# Patient Record
Sex: Female | Born: 1970
Health system: Southern US, Community
[De-identification: ages and names within clinical notes are randomized; demographics above are authoritative.]

## PROBLEM LIST (undated history)

## (undated) DIAGNOSIS — D219 Benign neoplasm of connective and other soft tissue, unspecified: Secondary | ICD-10-CM

## (undated) DIAGNOSIS — K219 Gastro-esophageal reflux disease without esophagitis: Secondary | ICD-10-CM

## (undated) HISTORY — DX: Benign neoplasm of connective and other soft tissue, unspecified: D21.9

## (undated) HISTORY — DX: Gastro-esophageal reflux disease without esophagitis: K21.9

---

## 2000-10-17 ENCOUNTER — Other Ambulatory Visit: Admission: RE | Admit: 2000-10-17 | Discharge: 2000-10-17 | Payer: Self-pay | Admitting: Obstetrics and Gynecology

## 2001-10-22 ENCOUNTER — Other Ambulatory Visit: Admission: RE | Admit: 2001-10-22 | Discharge: 2001-10-22 | Payer: Self-pay | Admitting: Obstetrics and Gynecology

## 2002-10-30 ENCOUNTER — Other Ambulatory Visit: Admission: RE | Admit: 2002-10-30 | Discharge: 2002-10-30 | Payer: Self-pay | Admitting: Obstetrics and Gynecology

## 2003-11-04 ENCOUNTER — Other Ambulatory Visit: Admission: RE | Admit: 2003-11-04 | Discharge: 2003-11-04 | Payer: Self-pay | Admitting: Obstetrics and Gynecology

## 2004-11-10 ENCOUNTER — Other Ambulatory Visit: Admission: RE | Admit: 2004-11-10 | Discharge: 2004-11-10 | Payer: Self-pay | Admitting: Obstetrics and Gynecology

## 2011-05-16 ENCOUNTER — Other Ambulatory Visit: Payer: Self-pay | Admitting: Obstetrics and Gynecology

## 2011-05-16 DIAGNOSIS — Z1231 Encounter for screening mammogram for malignant neoplasm of breast: Secondary | ICD-10-CM

## 2011-05-28 ENCOUNTER — Ambulatory Visit
Admission: RE | Admit: 2011-05-28 | Discharge: 2011-05-28 | Disposition: A | Discharge status: Home / Self Care | Payer: 59 | Source: Ambulatory Visit | Attending: Obstetrics and Gynecology | Admitting: Obstetrics and Gynecology

## 2011-05-28 DIAGNOSIS — Z1231 Encounter for screening mammogram for malignant neoplasm of breast: Secondary | ICD-10-CM

## 2011-05-29 ENCOUNTER — Other Ambulatory Visit: Payer: Self-pay | Admitting: Obstetrics and Gynecology

## 2011-05-29 DIAGNOSIS — R928 Other abnormal and inconclusive findings on diagnostic imaging of breast: Secondary | ICD-10-CM

## 2011-06-06 ENCOUNTER — Ambulatory Visit
Admission: RE | Admit: 2011-06-06 | Discharge: 2011-06-06 | Disposition: A | Discharge status: Home / Self Care | Payer: 59 | Source: Ambulatory Visit | Attending: Obstetrics and Gynecology | Admitting: Obstetrics and Gynecology

## 2011-06-06 DIAGNOSIS — R928 Other abnormal and inconclusive findings on diagnostic imaging of breast: Secondary | ICD-10-CM

## 2012-03-31 ENCOUNTER — Encounter (HOSPITAL_COMMUNITY): Payer: Self-pay | Admitting: Pharmacist

## 2012-04-11 ENCOUNTER — Other Ambulatory Visit (HOSPITAL_COMMUNITY): Payer: Self-pay

## 2012-04-24 ENCOUNTER — Encounter: Payer: Self-pay | Admitting: Obstetrics and Gynecology

## 2012-04-24 ENCOUNTER — Ambulatory Visit (INDEPENDENT_AMBULATORY_CARE_PROVIDER_SITE_OTHER): Payer: 59 | Admitting: Obstetrics and Gynecology

## 2012-04-24 ENCOUNTER — Encounter (HOSPITAL_COMMUNITY): Admission: RE | Payer: Self-pay | Source: Ambulatory Visit

## 2012-04-24 ENCOUNTER — Ambulatory Visit (HOSPITAL_COMMUNITY): Admission: RE | Admit: 2012-04-24 | Payer: 59 | Source: Ambulatory Visit | Admitting: Obstetrics and Gynecology

## 2012-04-24 VITALS — BP 110/74 | Ht 68.0 in | Wt 238.0 lb

## 2012-04-24 DIAGNOSIS — D259 Leiomyoma of uterus, unspecified: Secondary | ICD-10-CM

## 2012-04-24 SURGERY — HYSTERECTOMY, SUPRACERVICAL, LAPAROSCOPIC
Anesthesia: General

## 2012-04-24 NOTE — Progress Notes (Signed)
41 yo female here for advise on fibroids. Ultrasound at [facility] 04/03/12:  Uterus 11.1x11.7x10.8 cm Fibroids #1:subserosal 7.7 cm Ovaries not seen                 #2: intramural  3.6 cm EM not evaluated                 #3: intramural 1 cm                 #4: subserosal 9.9 cm Referred by patient of practice.  The following was reviewed with patient:  Benign nature of fibroids as well as unpredictable growth during reproductive years.    Possible fibroid symptoms including: menorrhagia, dysmenorrhea, urinary frequency, pelvic pain, and back pain.   Expected resolution/improvement of symptoms with menopausal state.  Treatment options: expectant management, NSAIDS, oral contraceptives, Depo Provera, Uterine Artery Embolization, myomectomy, and hysterectomy.  Surgical approach options were also reviewed including, laparoscopic, robotically assisted and abdominal. Risks, benefits and limitations also discussed.  After 40 minutes face to face encounter, the patient requests to proceed with robotically assisted hysterectomy.  Will schedule.

## 2012-04-24 NOTE — Patient Instructions (Signed)
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

## 2012-07-09 ENCOUNTER — Encounter (HOSPITAL_COMMUNITY): Payer: Self-pay | Admitting: *Deleted

## 2012-07-09 ENCOUNTER — Emergency Department (HOSPITAL_COMMUNITY): Payer: 59

## 2012-07-09 ENCOUNTER — Emergency Department (HOSPITAL_COMMUNITY)
Admission: EM | Admit: 2012-07-09 | Discharge: 2012-07-09 | Disposition: A | Discharge status: Home / Self Care | Payer: 59 | Attending: Emergency Medicine | Admitting: Emergency Medicine

## 2012-07-09 DIAGNOSIS — R0789 Other chest pain: Secondary | ICD-10-CM | POA: Insufficient documentation

## 2012-07-09 LAB — CBC
MCH: 28.4 pg (ref 26.0–34.0)
MCHC: 34 g/dL (ref 30.0–36.0)
Platelets: 233 10*3/uL (ref 150–400)
RBC: 4.47 MIL/uL (ref 3.87–5.11)

## 2012-07-09 LAB — POCT I-STAT TROPONIN I: Troponin i, poc: 0 ng/mL (ref 0.00–0.08)

## 2012-07-09 LAB — BASIC METABOLIC PANEL
Calcium: 9.3 mg/dL (ref 8.4–10.5)
GFR calc non Af Amer: 82 mL/min — ABNORMAL LOW (ref >90)
Sodium: 142 mEq/L (ref 135–145)

## 2012-07-09 MED ORDER — ASPIRIN 325 MG PO TABS: 325.0000 mg | ORAL_TABLET | ORAL | Status: DC

## 2012-07-09 MED ORDER — NITROGLYCERIN 0.4 MG SL SUBL: 0.4000 mg | SUBLINGUAL_TABLET | SUBLINGUAL | Status: DC | PRN

## 2012-07-09 MED ORDER — IBUPROFEN 800 MG PO TABS
800.0000 mg | ORAL_TABLET | Freq: Once | ORAL | Status: AC
  Administered 2012-07-09: 800 mg via ORAL
  Filled 2012-07-09: qty 1

## 2012-07-09 NOTE — ED Notes (Signed)
Patient reports onset of chest pain on yesterday while resting.  She states her pain is intermittent. She tried gas meds w/o relief.  Patient denies vomitting.  Patient denies sob.  Denies dizziness.  She reports she continues to have pain today

## 2012-07-09 NOTE — ED Notes (Signed)
Pt sts took adult ASA at home this am

## 2012-07-09 NOTE — ED Provider Notes (Signed)
History     CSN: [id number]  Arrival date & time 07/09/12  1191   First MD Initiated Contact with Patient 07/09/12 [id number]      Chief Complaint  Patient presents with  . Chest Pain    (Consider location/radiation/quality/duration/timing/severity/associated sxs/prior treatment) HPI 41 year old female had some mild chest pain starting yesterday. Pain is in the upper chest and described as a mild, dull pain. There is no radiation. Pain is worse if she moves in certain directions, but nothing made it better. She thought it may have been gas and she took Gas-X and took a dose of ranitidine. Pain is somewhat better today than he was yesterday but she got worried because there is still there. She denies dyspnea, nausea, vomiting, diaphoresis, fever, chills, cough. She is a nonsmoker and has no history of hypertension or diabetes or hyperlipidemia. She did drive to the beach and back in the last week-a ride of about 5 hours in each direction. She denies birth control pill use. She rates her pain today as 2/10, but it was as severe as 4/10. Past Medical History  Diagnosis Date  . Fibroids     History reviewed. No pertinent past surgical history.  Family History  Problem Relation Age of Onset  . Hypertension Paternal Grandmother   . Diabetes Paternal Grandmother   . Ovarian cancer Maternal Grandmother 60  . Hypertension Mother   . Colon cancer Paternal Aunt 108  . Hypertension Paternal Aunt   . Breast cancer Paternal Aunt 40  . Hypertension Paternal Uncle   . Diabetes Paternal Uncle   . Stroke Maternal Uncle     History  Substance Use Topics  . Smoking status: Never Smoker   . Smokeless tobacco: Never Used  . Alcohol Use: No    OB History    Grav Para Term Preterm Abortions TAB SAB Ect Mult Living   1 1 1  0 0 0 0 0 0 1      Review of Systems  Allergies  Review of patient's allergies indicates no known allergies.  Home Medications   Current Outpatient Rx  Name Route Sig  Dispense Refill  . VITAMIN B12 PO Oral Take 1 tablet by mouth daily.     . OMEGA-3 FATTY ACIDS 1000 MG PO CAPS Oral Take 1 g by mouth daily.     . ADULT MULTIVITAMIN W/MINERALS CH Oral Take 1 tablet by mouth daily.    Marland Kitchen RANITIDINE HCL 150 MG PO TABS Oral Take 150 mg by mouth once.    Marland Kitchen VITAMIN D (CHOLECALCIFEROL) PO Oral Take 1,000 Units by mouth daily.       BP 164/109  Pulse 88  Temp 98.5 F (36.9 C) (Oral)  Resp 16  Ht 5\' 8"  (1.727 m)  Wt 230 lb (104.327 kg)  BMI 34.97 kg/m2  SpO2 99%  LMP 06/25/2012  Physical Exam 41 year old female is resting comfortably and in no acute distress. Vital signs are significant for hypertension with blood pressure 164/109. Oxygen saturation is 99% which is normal. Head is normocephalic and atraumatic. PERRLA, EOMI. Neck is nontender and supple. Back is nontender. Lungs are clear without rales, wheezes, rhonchi. Heart has regular rate and rhythm without murmur. There is no chest wall tenderness. Abdomen is soft, flat, with mild epigastric tenderness. There no masses or hepatosplenomegaly. Extremities have full range of motion, no cyanosis or edema. Skin is warm and dry without rash. Neurologic: Mental status is normal, cranial nerves are intact, there are no motor or sensory  deficits. ED Course  Procedures (including critical care time)  Results for orders placed during the hospital encounter of 07/09/12  CBC      Component Value Range   WBC 7.5  4.0 - 10.5 K/uL   RBC 4.47  3.87 - 5.11 MIL/uL   Hemoglobin 12.7  12.0 - 15.0 g/dL   HCT 16.1  09.6 - 04.5 %   MCV 83.7  78.0 - 100.0 fL   MCH 28.4  26.0 - 34.0 pg   MCHC 34.0  30.0 - 36.0 g/dL   RDW 40.9  81.1 - 91.4 %   Platelets 233  150 - 400 K/uL  BASIC METABOLIC PANEL      Component Value Range   Sodium 142  135 - 145 mEq/L   Potassium 4.5  3.5 - 5.1 mEq/L   Chloride 106  96 - 112 mEq/L   CO2 26  19 - 32 mEq/L   Glucose, Bld 98  70 - 99 mg/dL   BUN 8  6 - 23 mg/dL   Creatinine, Ser 7.82   0.50 - 1.10 mg/dL   Calcium 9.3  8.4 - 95.6 mg/dL   GFR calc non Af Amer 82 (*) >90 mL/min   GFR calc Af Amer >90  >90 mL/min  POCT I-STAT TROPONIN I      Component Value Range   Troponin i, poc 0.00  0.00 - 0.08 ng/mL   Comment 3           D-DIMER, QUANTITATIVE      Component Value Range   D-Dimer, Quant 0.44  0.00 - 0.48 ug/mL-FEU   Dg Chest 2 View  07/09/2012  *RADIOLOGY REPORT*  Clinical Data: Chest pain, hypertension  CHEST - 2 VIEW  Comparison: None  Findings: Borderline enlargement of cardiac silhouette. Mediastinal contours and pulmonary vascularity normal. Lungs clear. No pleural effusion or pneumothorax. Bones unremarkable.  IMPRESSION: Borderline enlargement of cardiac silhouette. No acute abnormalities.  Original Report Authenticated By: Lollie Marrow, M.D.      1. Atypical chest pain       MDM  Chest pain which seems most likely to be GI in origin. However, she has not gotten relief from ranitidine and gas ex. She does have one risk factor for PE having taken a long car ride within the past week, so a d-dimer will be sent to rule out pulmonary embolism.      ECG shows normal sinus rhythm with a rate of 89, no ectopy. Normal axis. Normal P wave. Normal QRS. Normal intervals. Normal ST and T waves. Impression: normal ECG. No old ECG available for comparison.   Dione Booze, MD 07/09/12 [phone]  D-dimer is normal. She had no further relief with ibuprofen. Since she did a partial relief with ranitidine, she will be treated as possible GERD. She's advised to take Prilosec OTC once a day and followup with her PCP.  Dione Booze, MD 07/09/12 1045

## 2012-07-09 NOTE — ED Provider Notes (Deleted)
ECG shows normal sinus rhythm with a rate of 89, no ectopy. Normal axis. Normal P wave. Normal QRS. Normal intervals. Normal ST and T waves. Impression: normal ECG. No old ECG available for comparison.   Dione Booze, MD 07/09/12 [phone]

## 2012-07-24 ENCOUNTER — Other Ambulatory Visit: Payer: Self-pay | Admitting: Obstetrics and Gynecology

## 2012-07-24 DIAGNOSIS — Z1231 Encounter for screening mammogram for malignant neoplasm of breast: Secondary | ICD-10-CM

## 2012-07-29 ENCOUNTER — Ambulatory Visit: Payer: 59

## 2012-10-17 ENCOUNTER — Ambulatory Visit (INDEPENDENT_AMBULATORY_CARE_PROVIDER_SITE_OTHER): Payer: 59 | Admitting: Obstetrics and Gynecology

## 2012-10-17 ENCOUNTER — Ambulatory Visit (INDEPENDENT_AMBULATORY_CARE_PROVIDER_SITE_OTHER): Payer: 59

## 2012-10-17 ENCOUNTER — Encounter: Payer: Self-pay | Admitting: Obstetrics and Gynecology

## 2012-10-17 VITALS — BP 140/84 | Wt 233.0 lb

## 2012-10-17 DIAGNOSIS — D219 Benign neoplasm of connective and other soft tissue, unspecified: Secondary | ICD-10-CM

## 2012-10-17 DIAGNOSIS — D259 Leiomyoma of uterus, unspecified: Secondary | ICD-10-CM

## 2012-10-17 NOTE — Progress Notes (Signed)
Subjective:    Sara Hickman is a 41 y.o. female, G1P1001, who presents for Gyn ultrasound because of Fibroids .  The following portions of the patient's history were reviewed and updated as appropriate: allergies, current medications, past family history.  Objective:    BP 140/84  Wt 233 lb (105.688 kg)  LMP 09/24/2012    Weight:  Wt Readings from Last 1 Encounters:  10/17/12 233 lb (105.688 kg)          BMI: There is no height on file to calculate BMI.  ULTRASOUND: Uterus RV    Adnexa normal    Endometrium 7.87 mm    Free fluid: no    Other findings:  L pedunculated fibroid 6.56cm, subserosal fibroid 3.85 cm and posterior intramural fibroid 8.62cm.   Assessment:    asymptomatic fibroids without any growth in 6 months    Plan:    Pt chooses expectant management Follow-up PRN or AEX  Silverio Lay MD

## 2012-10-20 ENCOUNTER — Other Ambulatory Visit: Payer: Self-pay | Admitting: Obstetrics and Gynecology

## 2012-10-20 DIAGNOSIS — D259 Leiomyoma of uterus, unspecified: Secondary | ICD-10-CM

## 2012-11-07 ENCOUNTER — Encounter: Payer: 59 | Admitting: Obstetrics and Gynecology

## 2013-03-20 ENCOUNTER — Other Ambulatory Visit: Payer: Self-pay | Admitting: Obstetrics and Gynecology

## 2013-03-21 LAB — CBC
HCT: 37.8 % (ref 36.0–46.0)
Hemoglobin: 12.6 g/dL (ref 12.0–15.0)
WBC: 9.1 10*3/uL (ref 4.0–10.5)

## 2013-04-24 ENCOUNTER — Ambulatory Visit: Payer: 59

## 2013-04-30 ENCOUNTER — Ambulatory Visit
Admission: RE | Admit: 2013-04-30 | Discharge: 2013-04-30 | Disposition: A | Discharge status: Home / Self Care | Payer: 59 | Source: Ambulatory Visit | Attending: Obstetrics and Gynecology | Admitting: Obstetrics and Gynecology

## 2013-04-30 DIAGNOSIS — Z1231 Encounter for screening mammogram for malignant neoplasm of breast: Secondary | ICD-10-CM

## 2013-10-02 ENCOUNTER — Other Ambulatory Visit: Payer: Self-pay | Admitting: Obstetrics and Gynecology

## 2013-10-07 ENCOUNTER — Encounter (HOSPITAL_COMMUNITY): Payer: Self-pay | Admitting: Pharmacist

## 2013-10-08 ENCOUNTER — Other Ambulatory Visit: Payer: Self-pay | Admitting: Obstetrics and Gynecology

## 2013-10-15 ENCOUNTER — Inpatient Hospital Stay (HOSPITAL_COMMUNITY)
Admission: RE | Admit: 2013-10-15 | Discharge: 2013-10-15 | Disposition: A | Discharge status: Home / Self Care | Payer: 59 | Source: Ambulatory Visit

## 2013-10-15 NOTE — Patient Instructions (Signed)
   Your procedure is scheduled on: Thursday, December 11  Enter through the [employer] of [facility] at: 6 AM Pick up the phone at the desk and dial [phone] and inform us of your arrival.  Please call this number if you have any problems the morning of surgery: [phone]  Remember: Do not eat or drink after midnight::Wednesday Take these medicines the morning of surgery with a SIP OF WATER:  Do not wear jewelry, make-up, or FINGER nail polish No metal in your hair or on your body. Do not wear lotions, powders, perfumes. You may wear deodorant.  Please use your CHG wash as directed prior to surgery.  Do not shave anywhere for at least 12 hours prior to first CHG shower.  Do not bring valuables to the hospital. Contacts, dentures or bridgework may not be worn into surgery.  Leave suitcase in the car. After Surgery it may be brought to your room. For patients being admitted to the hospital, checkout time is 11:00am the day of discharge.  Patients discharged on the day of surgery will not be allowed to drive home.

## 2013-10-29 ENCOUNTER — Other Ambulatory Visit: Payer: Self-pay

## 2013-11-03 ENCOUNTER — Encounter: Payer: Self-pay | Admitting: Family Medicine

## 2013-11-05 ENCOUNTER — Other Ambulatory Visit: Payer: Self-pay | Admitting: Obstetrics and Gynecology

## 2013-11-05 DIAGNOSIS — D219 Benign neoplasm of connective and other soft tissue, unspecified: Secondary | ICD-10-CM

## 2013-11-11 ENCOUNTER — Encounter: Payer: Self-pay | Admitting: Family Medicine

## 2013-11-11 ENCOUNTER — Ambulatory Visit (INDEPENDENT_AMBULATORY_CARE_PROVIDER_SITE_OTHER): Payer: 59 | Admitting: Family Medicine

## 2013-11-11 VITALS — BP 120/80 | HR 68 | Temp 97.9°F | Resp 18 | Ht 68.0 in | Wt 248.0 lb

## 2013-11-11 DIAGNOSIS — E669 Obesity, unspecified: Secondary | ICD-10-CM

## 2013-11-11 DIAGNOSIS — Z13220 Encounter for screening for lipoid disorders: Secondary | ICD-10-CM

## 2013-11-11 DIAGNOSIS — Z23 Encounter for immunization: Secondary | ICD-10-CM

## 2013-11-11 DIAGNOSIS — Z130 Encounter for screening for diseases of the blood and blood-forming organs and certain disorders involving the immune mechanism: Secondary | ICD-10-CM

## 2013-11-11 DIAGNOSIS — Z0000 Encounter for general adult medical examination without abnormal findings: Secondary | ICD-10-CM

## 2013-11-11 DIAGNOSIS — D259 Leiomyoma of uterus, unspecified: Secondary | ICD-10-CM

## 2013-11-11 DIAGNOSIS — Z1321 Encounter for screening for nutritional disorder: Secondary | ICD-10-CM

## 2013-11-11 LAB — CBC WITH DIFFERENTIAL/PLATELET
Basophils Absolute: 0 10*3/uL (ref 0.0–0.1)
Basophils Relative: 0 % (ref 0–1)
Hemoglobin: 13 g/dL (ref 12.0–15.0)
MCHC: 33.6 g/dL (ref 30.0–36.0)
Monocytes Relative: 7 % (ref 3–12)
Neutro Abs: 4.5 10*3/uL (ref 1.7–7.7)
Neutrophils Relative %: 60 % (ref 43–77)
Platelets: 262 10*3/uL (ref 150–400)
RBC: 4.63 MIL/uL (ref 3.87–5.11)

## 2013-11-11 LAB — LIPID PANEL
Cholesterol: 160 mg/dL (ref 0–200)
Triglycerides: 68 mg/dL (ref <150)
VLDL: 14 mg/dL (ref 0–40)

## 2013-11-11 LAB — COMPREHENSIVE METABOLIC PANEL
AST: 14 U/L (ref 0–37)
Albumin: 4.3 g/dL (ref 3.5–5.2)
BUN: 15 mg/dL (ref 6–23)
Calcium: 9.3 mg/dL (ref 8.4–10.5)
Chloride: 104 mEq/L (ref 96–112)
Glucose, Bld: 95 mg/dL (ref 70–99)
Potassium: 4.7 mEq/L (ref 3.5–5.3)
Total Protein: 6.8 g/dL (ref 6.0–8.3)

## 2013-11-11 MED ORDER — RANITIDINE HCL 150 MG PO TABS: 150.0000 mg | ORAL_TABLET | Freq: Two times a day (BID) | ORAL | Status: DC

## 2013-11-11 NOTE — Patient Instructions (Addendum)
I recommend eye visit once a year I recommend dental visit every 6 months Goal is to  Exercise 30 minutes 5 days a week We will send a letter with lab results  Release of records- [facility] [facility]- Immunizations Only  F/U as needed

## 2013-11-12 ENCOUNTER — Other Ambulatory Visit: Payer: Self-pay | Admitting: Obstetrics and Gynecology

## 2013-11-14 ENCOUNTER — Other Ambulatory Visit: Payer: 59

## 2013-11-14 DIAGNOSIS — D259 Leiomyoma of uterus, unspecified: Secondary | ICD-10-CM | POA: Insufficient documentation

## 2013-11-14 DIAGNOSIS — E669 Obesity, unspecified: Secondary | ICD-10-CM | POA: Insufficient documentation

## 2013-11-14 NOTE — Assessment & Plan Note (Signed)
Discussed importance of diet and exercise, healthy weight. She is committed to restart her exercise program she has home equipment

## 2013-11-14 NOTE — Progress Notes (Signed)
  Subjective:    Patient ID: Sara Hickman, female    DOB: [birth year], 42 y.o.   MRN: [id number]  HPI  Patient here for complete physical exam. She was last seen in our office 2 years ago. She has no chronic medical problems. She has had instances of elevated blood pressure which she's been working on her exercise and diet to control. She has no specific concerns today. She is due for fasting labs. Her Pap smears are up-to-date by her GYN she's currently having workup for her fibroids and they're planning for a myomectomy. Immunizations reiewed Mammo- UTD Review of Systems  GEN- denies fatigue, fever, weight loss,weakness, recent illness HEENT- denies eye drainage, change in vision, nasal discharge, CVS- denies chest pain, palpitations RESP- denies SOB, cough, wheeze ABD- denies N/V, change in stools, abd pain GU- denies dysuria, hematuria, dribbling, incontinence MSK- denies joint pain, muscle aches, injury Neuro- denies headache, dizziness, syncope, seizure activity      Objective:   Physical Exam GEN- NAD, alert and oriented x3 HEENT- PERRL, EOMI, non injected sclera, pink conjunctiva, MMM, oropharynx clear Neck- Supple, no thyromegaly CVS- RRR, no murmur RESP-CTAB ABD-NABS,soft,fibroid uterus felt 2cm below umbilicus, NT, GU-Deferred EXT- No edema Pulses- Radial, DP- 2+        Assessment & Plan:   V70.0-physical complete, fasting labs, mammogram up to date Flu shot given  She has a GYN   She believes she had her TDAP Done at the health department where she went out of the country for a trip so I will obtain these records

## 2013-11-14 NOTE — Assessment & Plan Note (Signed)
Surgical intervention per GYN she is also status post a Lupron injection

## 2013-11-16 ENCOUNTER — Encounter: Payer: Self-pay | Admitting: *Deleted

## 2013-11-25 ENCOUNTER — Encounter (HOSPITAL_COMMUNITY)
Admission: RE | Admit: 2013-11-25 | Discharge: 2013-11-25 | Disposition: A | Discharge status: Home / Self Care | Payer: 59 | Source: Ambulatory Visit | Attending: Obstetrics and Gynecology | Admitting: Obstetrics and Gynecology

## 2013-11-25 ENCOUNTER — Encounter (HOSPITAL_COMMUNITY): Payer: Self-pay

## 2013-11-25 DIAGNOSIS — Z01812 Encounter for preprocedural laboratory examination: Secondary | ICD-10-CM | POA: Insufficient documentation

## 2013-11-25 DIAGNOSIS — Z01818 Encounter for other preprocedural examination: Secondary | ICD-10-CM | POA: Insufficient documentation

## 2013-11-25 NOTE — Patient Instructions (Addendum)
   Your procedure is scheduled on: Thursday, Dec 11  Enter through the [employer] of [facility] at: 6 AM Pick up the phone at the desk and dial [phone] and inform us of your arrival.   Please call this number if you have any problems the morning of surgery: [phone]  Remember: Do not eat or drink after midnight:  Wednesday Take these medicines the morning of surgery with a SIP OF WATER:  zantac  Do not wear jewelry, make-up, or FINGER nail polish No metal in your hair or on your body. Do not wear lotions, powders, perfumes. You may wear deodorant.  Please use your CHG wash as directed prior to surgery.  Do not shave anywhere for at least 12 hours prior to first CHG shower.  Do not bring valuables to the hospital. Contacts, dentures or bridgework may not be worn into surgery.  Leave suitcase in the car. After Surgery it may be brought to your room. For patients being admitted to the hospital, checkout time is 11:00am the day of discharge.  [place] with husband Sara Hickman cell [phone].

## 2013-11-25 NOTE — Pre-Procedure Instructions (Signed)
No labs drawn.  Copy of 11/07/2013 labs on front of chart.

## 2013-11-28 ENCOUNTER — Ambulatory Visit
Admission: RE | Admit: 2013-11-28 | Discharge: 2013-11-28 | Disposition: A | Discharge status: Home / Self Care | Payer: 59 | Source: Ambulatory Visit | Attending: Obstetrics and Gynecology | Admitting: Obstetrics and Gynecology

## 2013-11-28 ENCOUNTER — Other Ambulatory Visit: Payer: 59

## 2013-11-28 DIAGNOSIS — D219 Benign neoplasm of connective and other soft tissue, unspecified: Secondary | ICD-10-CM

## 2013-11-28 MED ORDER — GADOBENATE DIMEGLUMINE 529 MG/ML IV SOLN
20.0000 mL | Freq: Once | INTRAVENOUS | Status: AC | PRN
  Administered 2013-11-28: 20 mL via INTRAVENOUS

## 2013-12-01 ENCOUNTER — Other Ambulatory Visit: Payer: Self-pay | Admitting: Obstetrics and Gynecology

## 2013-12-01 ENCOUNTER — Other Ambulatory Visit (HOSPITAL_COMMUNITY): Payer: Self-pay | Admitting: Obstetrics and Gynecology

## 2013-12-01 NOTE — H&P (Signed)
Sara Hickman is a 42 y.o. female P 1-0-0-1 for hysterectomy because of symptomatic uterine fibroids. Over the past year and a half the patient observed increase abdominal girth, a feeling of fullness and increase urinary frequency. After evaluation she was found to have uterine fibroids. She reports a menstrual flow that lasts 4 days and a need to change her pad every 2 hours. On occasion she will experience her menses a week earlier than expected but no intermenstrual bleeding, post coital spotting, dyspareunia or pelvic pain. During her period she reports cramping that is rated 5/10 on a 10 point pain scale. A CBC in April 2014 was normal and pelvic ultrasound July 2014 showed: uterus: 18.4 x 12.2 x 12.1 cm, large subserosal fundal fibroid, 12.3 x 11.1 x 12.6 cm; left ovary-3.73 x 2.25 x 1.89 cm and right ovary-3.25 x 2.21 x 1.61cm.  After being on Lupron Depot for 3 months, a follow up MRI on 11/28/2013 showed  a retroverted uterus that was not measured, noted were numerous uterine fibroids with none that were submucosal but endometrial stripe was normal.  The largest fibroid was projecting off anterior aspect of myometrium, largely exophytic 11.2 x 11.0 x 12.5 cm;  the second largest projects off posterior myometrium and into the cul-de-sac measuring 7.0 x 6.4 x 6.3 cm;  other smaller fibroids noted.  All fibroids demonstrated some degree of enhancement post contrast though large fibroid demonstrates some areas of probable degeneration/necrosis;  both ovaries appear normal as does cervix;  no pelvic adenopathy or significant free fluid collection. A review of medical and surgical management options were given to the patient however she wants to pursue definitive therapy in the form of hysterectomy.  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 [employer]S GARLIC ODORLESS TABS Take 1 tablet by mouth daily.  . Cyanocobalamin (VITAMIN B12 PO) Take 1,000 mcg by mouth daily.   . fish oil-omega-3 fatty acids 1000 MG capsule Take 1 g by mouth daily.   . Multiple Vitamin (MULITIVITAMIN WITH MINERALS) TABS Take 1 tablet by mouth daily.  . ranitidine (ZANTAC) 150 MG tablet Take 1 tablet (150 mg total) by mouth 2 (two) times daily.  . VITAMIN D, CHOLECALCIFEROL, PO Take 1,000 Units by mouth daily.    Denies sensitivity to peanuts, shellfish, soy, latex or adhesives.  No Known Allergies  ROS: Denies corrective lenses,  headache, vision changes, nasal congestion, dysphagia, tinnitus, dizziness, hoarseness, cough,  chest pain, shortness of breath, nausea, vomiting, diarrhea,constipation,  urinary frequency, urgency  dysuria, hematuria, vaginitis symptoms, pelvic pain, swelling of joints,easy bruising,  myalgias, arthralgias, skin rashes, unexplained weight loss and except as is mentioned in the history of present illness, patient's review of systems is otherwise negative.  Physical Exam  Bp: 130/86     P: 86    R: 12   Temperature: 98.2 degrees F orally    Weight: 249.5 lbs.  Height: 5' 9"   BMI: 36.8  Neck: supple without masses or   thyromegaly Lungs: clear to auscultation Heart: regular rate and rhythm Abdomen: soft, non-tender mass arising from the pelvis to the level of umbilicus and no organomegaly Pelvic:EGBUS- wnl; vagina-normal rugae; uterus-irregular and enlarged to approximately 20-22 weeks size;  cervix-not visualized due to uterine position adnexae-obscured by mass effect of uterus Extremities:  no  clubbing, cyanosis or edema  Endometrial Biopsy: precluded due to acute displacement of the cervix  Assesment:  Large Symptomatic Uterine Fibroids   Disposition:  A discussion was held with patient regarding the indication for her procedure(s) along with the risks, which include but are not limited to: reaction to anesthesia, damage to adjacent organs, infection, pelvic prolapse and excessive bleeding.  The patient verbalized understanding of these risks and has consented to proceed with a Total Abdominal Hysterectomy with Bilateral Salpingectomy at  [facility] on December 03, 2013 at 7:30 a.m.   CSN# [id number]   Pelham Hennick J. Lamis Behrmann, PA-C  for Dr.  Sandra A. Rivard    

## 2013-12-02 MED ORDER — DEXTROSE 5 % IV SOLN
2.0000 g | INTRAVENOUS | Status: AC
  Administered 2013-12-03: 2 g via INTRAVENOUS
  Filled 2013-12-02: qty 2

## 2013-12-03 ENCOUNTER — Encounter (HOSPITAL_COMMUNITY)
Admission: RE | Disposition: A | Discharge status: Home / Self Care | Payer: Self-pay | Source: Ambulatory Visit | Attending: Obstetrics and Gynecology

## 2013-12-03 ENCOUNTER — Encounter (HOSPITAL_COMMUNITY): Payer: 59 | Admitting: Anesthesiology

## 2013-12-03 ENCOUNTER — Inpatient Hospital Stay (HOSPITAL_COMMUNITY): Payer: 59 | Admitting: Anesthesiology

## 2013-12-03 ENCOUNTER — Encounter (HOSPITAL_COMMUNITY): Payer: Self-pay | Admitting: General Practice

## 2013-12-03 ENCOUNTER — Inpatient Hospital Stay (HOSPITAL_COMMUNITY)
Admission: RE | Admit: 2013-12-03 | Discharge: 2013-12-05 | DRG: 743 | Disposition: A | Discharge status: Home / Self Care | Payer: 59 | Source: Ambulatory Visit | Attending: Obstetrics and Gynecology | Admitting: Obstetrics and Gynecology

## 2013-12-03 DIAGNOSIS — N852 Hypertrophy of uterus: Secondary | ICD-10-CM | POA: Diagnosis present

## 2013-12-03 DIAGNOSIS — IMO0001 Reserved for inherently not codable concepts without codable children: Secondary | ICD-10-CM | POA: Diagnosis not present

## 2013-12-03 DIAGNOSIS — N838 Other noninflammatory disorders of ovary, fallopian tube and broad ligament: Secondary | ICD-10-CM | POA: Diagnosis present

## 2013-12-03 DIAGNOSIS — D250 Submucous leiomyoma of uterus: Principal | ICD-10-CM | POA: Diagnosis present

## 2013-12-03 DIAGNOSIS — D251 Intramural leiomyoma of uterus: Secondary | ICD-10-CM | POA: Diagnosis present

## 2013-12-03 DIAGNOSIS — D219 Benign neoplasm of connective and other soft tissue, unspecified: Secondary | ICD-10-CM | POA: Diagnosis present

## 2013-12-03 DIAGNOSIS — N854 Malposition of uterus: Secondary | ICD-10-CM | POA: Diagnosis present

## 2013-12-03 DIAGNOSIS — I10 Essential (primary) hypertension: Secondary | ICD-10-CM | POA: Diagnosis present

## 2013-12-03 DIAGNOSIS — R51 Headache: Secondary | ICD-10-CM | POA: Diagnosis present

## 2013-12-03 DIAGNOSIS — D252 Subserosal leiomyoma of uterus: Secondary | ICD-10-CM | POA: Diagnosis present

## 2013-12-03 HISTORY — PX: BILATERAL SALPINGECTOMY: SHX5743

## 2013-12-03 HISTORY — PX: ABDOMINAL HYSTERECTOMY: SHX81

## 2013-12-03 LAB — PREGNANCY, URINE: Preg Test, Ur: NEGATIVE

## 2013-12-03 SURGERY — HYSTERECTOMY, ABDOMINAL
Anesthesia: General | Site: Abdomen

## 2013-12-03 MED ORDER — FENTANYL CITRATE 0.05 MG/ML IJ SOLN
INTRAMUSCULAR | Status: AC
  Filled 2013-12-03: qty 5

## 2013-12-03 MED ORDER — LIDOCAINE HCL (CARDIAC) 20 MG/ML IV SOLN
INTRAVENOUS | Status: DC | PRN
  Administered 2013-12-03: 80 mg via INTRAVENOUS

## 2013-12-03 MED ORDER — DIPHENHYDRAMINE HCL 50 MG/ML IJ SOLN: 12.5000 mg | Freq: Four times a day (QID) | INTRAMUSCULAR | Status: DC | PRN

## 2013-12-03 MED ORDER — HYDROMORPHONE HCL PF 1 MG/ML IJ SOLN
INTRAMUSCULAR | Status: AC
  Filled 2013-12-03: qty 1

## 2013-12-03 MED ORDER — KETOROLAC TROMETHAMINE 30 MG/ML IJ SOLN
INTRAMUSCULAR | Status: DC | PRN
  Administered 2013-12-03: 30 mg via INTRAMUSCULAR
  Administered 2013-12-03: 30 mg via INTRAVENOUS

## 2013-12-03 MED ORDER — FAMOTIDINE 20 MG PO TABS
20.0000 mg | ORAL_TABLET | Freq: Two times a day (BID) | ORAL | Status: DC
  Administered 2013-12-03 – 2013-12-05 (×4): 20 mg via ORAL
  Filled 2013-12-03 (×4): qty 1

## 2013-12-03 MED ORDER — LIDOCAINE HCL 1 % IJ SOLN
INTRAMUSCULAR | Status: DC | PRN
  Administered 2013-12-03: 20 mL

## 2013-12-03 MED ORDER — HYDROMORPHONE 0.3 MG/ML IV SOLN
INTRAVENOUS | Status: DC
  Administered 2013-12-03: 12:00:00 via INTRAVENOUS
  Administered 2013-12-03 – 2013-12-04 (×2): 0.3 mg via INTRAVENOUS
  Filled 2013-12-03: qty 25

## 2013-12-03 MED ORDER — BUPIVACAINE HCL (PF) 0.5 % IJ SOLN
INTRAMUSCULAR | Status: AC
  Filled 2013-12-03: qty 270

## 2013-12-03 MED ORDER — LACTATED RINGERS IV SOLN
INTRAVENOUS | Status: DC
  Administered 2013-12-03 (×2): via INTRAVENOUS

## 2013-12-03 MED ORDER — PHENYLEPHRINE HCL 10 MG/ML IJ SOLN
INTRAMUSCULAR | Status: DC | PRN
  Administered 2013-12-03 (×3): 40 ug via INTRAVENOUS
  Administered 2013-12-03: 80 ug via INTRAVENOUS

## 2013-12-03 MED ORDER — PROPOFOL 10 MG/ML IV BOLUS
INTRAVENOUS | Status: DC | PRN
  Administered 2013-12-03: 200 mg via INTRAVENOUS

## 2013-12-03 MED ORDER — GLYCOPYRROLATE 0.2 MG/ML IJ SOLN
INTRAMUSCULAR | Status: AC
  Filled 2013-12-03: qty 3

## 2013-12-03 MED ORDER — SODIUM CHLORIDE 0.9 % IJ SOLN: 9.0000 mL | INTRAMUSCULAR | Status: DC | PRN

## 2013-12-03 MED ORDER — HEPARIN SODIUM (PORCINE) 5000 UNIT/ML IJ SOLN
INTRAMUSCULAR | Status: AC
  Filled 2013-12-03: qty 1

## 2013-12-03 MED ORDER — ONDANSETRON HCL 4 MG/2ML IJ SOLN
INTRAMUSCULAR | Status: DC | PRN
  Administered 2013-12-03: 4 mg via INTRAVENOUS

## 2013-12-03 MED ORDER — DIPHENHYDRAMINE HCL 12.5 MG/5ML PO ELIX: 12.5000 mg | ORAL_SOLUTION | Freq: Four times a day (QID) | ORAL | Status: DC | PRN

## 2013-12-03 MED ORDER — MENTHOL 3 MG MT LOZG: 1.0000 | LOZENGE | OROMUCOSAL | Status: DC | PRN

## 2013-12-03 MED ORDER — MIDAZOLAM HCL 2 MG/2ML IJ SOLN
INTRAMUSCULAR | Status: AC
  Filled 2013-12-03: qty 2

## 2013-12-03 MED ORDER — DEXAMETHASONE SODIUM PHOSPHATE 10 MG/ML IJ SOLN
INTRAMUSCULAR | Status: AC
  Filled 2013-12-03: qty 1

## 2013-12-03 MED ORDER — KETOROLAC TROMETHAMINE 30 MG/ML IJ SOLN
INTRAMUSCULAR | Status: AC
  Filled 2013-12-03: qty 1

## 2013-12-03 MED ORDER — BUPIVACAINE 0.5 % ON-Q PUMP DUAL CATH 300 ML
INJECTION | Status: DC | PRN
  Administered 2013-12-03: 270 mL

## 2013-12-03 MED ORDER — ONDANSETRON HCL 4 MG/2ML IJ SOLN
INTRAMUSCULAR | Status: AC
  Filled 2013-12-03: qty 2

## 2013-12-03 MED ORDER — ROCURONIUM BROMIDE 100 MG/10ML IV SOLN
INTRAVENOUS | Status: AC
  Filled 2013-12-03: qty 1

## 2013-12-03 MED ORDER — OXYCODONE-ACETAMINOPHEN 5-325 MG PO TABS
1.0000 | ORAL_TABLET | ORAL | Status: DC | PRN
  Filled 2013-12-03: qty 1

## 2013-12-03 MED ORDER — METOCLOPRAMIDE HCL 5 MG/ML IJ SOLN: 10.0000 mg | Freq: Once | INTRAMUSCULAR | Status: DC | PRN

## 2013-12-03 MED ORDER — PROPOFOL 10 MG/ML IV EMUL
INTRAVENOUS | Status: AC
  Filled 2013-12-03: qty 20

## 2013-12-03 MED ORDER — LIDOCAINE HCL 1 % IJ SOLN
INTRAMUSCULAR | Status: AC
  Filled 2013-12-03: qty 20

## 2013-12-03 MED ORDER — ACETAMINOPHEN 160 MG/5ML PO SOLN
975.0000 mg | Freq: Once | ORAL | Status: AC
  Administered 2013-12-03: 975 mg via ORAL

## 2013-12-03 MED ORDER — NALOXONE HCL 0.4 MG/ML IJ SOLN: 0.4000 mg | INTRAMUSCULAR | Status: DC | PRN

## 2013-12-03 MED ORDER — LACTATED RINGERS IV SOLN
INTRAVENOUS | Status: DC
  Administered 2013-12-03 (×2): via INTRAVENOUS

## 2013-12-03 MED ORDER — GLYCOPYRROLATE 0.2 MG/ML IJ SOLN
INTRAMUSCULAR | Status: DC | PRN
  Administered 2013-12-03: .8 mg via INTRAVENOUS

## 2013-12-03 MED ORDER — DEXAMETHASONE SODIUM PHOSPHATE 10 MG/ML IJ SOLN
INTRAMUSCULAR | Status: DC | PRN
  Administered 2013-12-03: 10 mg via INTRAVENOUS

## 2013-12-03 MED ORDER — MEPERIDINE HCL 25 MG/ML IJ SOLN: 6.2500 mg | INTRAMUSCULAR | Status: DC | PRN

## 2013-12-03 MED ORDER — IBUPROFEN 600 MG PO TABS
600.0000 mg | ORAL_TABLET | Freq: Four times a day (QID) | ORAL | Status: DC | PRN
  Administered 2013-12-04 – 2013-12-05 (×4): 600 mg via ORAL
  Filled 2013-12-03 (×4): qty 1

## 2013-12-03 MED ORDER — MIDAZOLAM HCL 2 MG/2ML IJ SOLN
INTRAMUSCULAR | Status: DC | PRN
  Administered 2013-12-03: 2 mg via INTRAVENOUS

## 2013-12-03 MED ORDER — PHENYLEPHRINE 40 MCG/ML (10ML) SYRINGE FOR IV PUSH (FOR BLOOD PRESSURE SUPPORT)
PREFILLED_SYRINGE | INTRAVENOUS | Status: AC
  Filled 2013-12-03: qty 5

## 2013-12-03 MED ORDER — LIDOCAINE HCL (CARDIAC) 20 MG/ML IV SOLN
INTRAVENOUS | Status: AC
  Filled 2013-12-03: qty 5

## 2013-12-03 MED ORDER — NEOSTIGMINE METHYLSULFATE 1 MG/ML IJ SOLN
INTRAMUSCULAR | Status: DC | PRN
  Administered 2013-12-03: 4 mg via INTRAVENOUS

## 2013-12-03 MED ORDER — FENTANYL CITRATE 0.05 MG/ML IJ SOLN
INTRAMUSCULAR | Status: DC | PRN
  Administered 2013-12-03 (×2): 50 ug via INTRAVENOUS

## 2013-12-03 MED ORDER — ONDANSETRON HCL 4 MG PO TABS: 4.0000 mg | ORAL_TABLET | Freq: Three times a day (TID) | ORAL | Status: DC | PRN

## 2013-12-03 MED ORDER — BUPIVACAINE HCL (PF) 0.25 % IJ SOLN
INTRAMUSCULAR | Status: DC | PRN
  Administered 2013-12-03: 20 mL

## 2013-12-03 MED ORDER — KETOROLAC TROMETHAMINE 30 MG/ML IJ SOLN
30.0000 mg | Freq: Four times a day (QID) | INTRAMUSCULAR | Status: AC
  Administered 2013-12-03 – 2013-12-04 (×3): 30 mg via INTRAVENOUS
  Filled 2013-12-03 (×3): qty 1

## 2013-12-03 MED ORDER — HYDROMORPHONE HCL PF 1 MG/ML IJ SOLN
0.2500 mg | INTRAMUSCULAR | Status: DC | PRN
  Administered 2013-12-03: 0.5 mg via INTRAVENOUS

## 2013-12-03 MED ORDER — ACETAMINOPHEN 160 MG/5ML PO SOLN
ORAL | Status: AC
  Filled 2013-12-03: qty 40.6

## 2013-12-03 MED ORDER — KETOROLAC TROMETHAMINE 30 MG/ML IJ SOLN: 15.0000 mg | Freq: Once | INTRAMUSCULAR | Status: DC | PRN

## 2013-12-03 MED ORDER — NEOSTIGMINE METHYLSULFATE 1 MG/ML IJ SOLN
INTRAMUSCULAR | Status: AC
  Filled 2013-12-03: qty 1

## 2013-12-03 MED ORDER — ROCURONIUM BROMIDE 100 MG/10ML IV SOLN
INTRAVENOUS | Status: DC | PRN
  Administered 2013-12-03: 50 mg via INTRAVENOUS
  Administered 2013-12-03: 20 mg via INTRAVENOUS
  Administered 2013-12-03: 10 mg via INTRAVENOUS

## 2013-12-03 MED ORDER — DOCUSATE SODIUM 100 MG PO CAPS
100.0000 mg | ORAL_CAPSULE | Freq: Two times a day (BID) | ORAL | Status: DC
  Administered 2013-12-04 – 2013-12-05 (×3): 100 mg via ORAL
  Filled 2013-12-03 (×3): qty 1

## 2013-12-03 MED ORDER — ONDANSETRON HCL 4 MG/2ML IJ SOLN
4.0000 mg | Freq: Four times a day (QID) | INTRAMUSCULAR | Status: DC | PRN
  Administered 2013-12-03: 4 mg via INTRAVENOUS
  Filled 2013-12-03: qty 2

## 2013-12-03 MED ORDER — BUPIVACAINE HCL (PF) 0.25 % IJ SOLN
INTRAMUSCULAR | Status: AC
  Filled 2013-12-03: qty 30

## 2013-12-03 MED ORDER — NIFEDIPINE ER 30 MG PO TB24
30.0000 mg | ORAL_TABLET | Freq: Every day | ORAL | Status: DC
  Administered 2013-12-03 – 2013-12-04 (×2): 30 mg via ORAL
  Filled 2013-12-03 (×3): qty 1

## 2013-12-03 SURGICAL SUPPLY — 52 items
CANISTER SUCT 3000ML (MISCELLANEOUS) ×3 IMPLANT
CATH KIT ON Q 5IN DUAL SLV (PAIN MANAGEMENT) IMPLANT
CATH KIT ON Q 5IN SLV (PAIN MANAGEMENT) ×1 IMPLANT
CELLS DAT CNTRL 66122 CELL SVR (MISCELLANEOUS) IMPLANT
CHLORAPREP W/TINT 26ML (MISCELLANEOUS) ×3 IMPLANT
CLOTH BEACON ORANGE TIMEOUT ST (SAFETY) ×3 IMPLANT
DECANTER SPIKE VIAL GLASS SM (MISCELLANEOUS) IMPLANT
DISSECTOR BLUNT CHERRY 10MM (MISCELLANEOUS) ×6 IMPLANT
DRAPE WARM FLUID 44X44 (DRAPE) IMPLANT
DRSG OPSITE POSTOP 4X10 (GAUZE/BANDAGES/DRESSINGS) ×1 IMPLANT
DRSG TEGADERM 2.38X2.75 (GAUZE/BANDAGES/DRESSINGS) ×2 IMPLANT
GAUZE SPONGE 4X4 16PLY XRAY LF (GAUZE/BANDAGES/DRESSINGS) ×3 IMPLANT
GLOVE BIOGEL PI IND STRL 7.0 (GLOVE) ×2 IMPLANT
GLOVE BIOGEL PI INDICATOR 7.0 (GLOVE) ×1
GOWN PREVENTION PLUS LG XLONG (DISPOSABLE) ×9 IMPLANT
NDL HYPO 25X1 1.5 SAFETY (NEEDLE) ×2 IMPLANT
NEEDLE HYPO 25X1 1.5 SAFETY (NEEDLE) ×3 IMPLANT
NS IRRIG 1000ML POUR BTL (IV SOLUTION) ×3 IMPLANT
PACK ABDOMINAL GYN (CUSTOM PROCEDURE TRAY) ×3 IMPLANT
PAD OB MATERNITY 4.3X12.25 (PERSONAL CARE ITEMS) ×3 IMPLANT
PROTECTOR NERVE ULNAR (MISCELLANEOUS) ×3 IMPLANT
RETAINER VISCERAL (MISCELLANEOUS) ×1 IMPLANT
RETRACTOR WND ALEXIS 18 MED (MISCELLANEOUS) IMPLANT
RETRACTOR WND ALEXIS 25 LRG (MISCELLANEOUS) IMPLANT
RTRCTR WOUND ALEXIS 18CM MED (MISCELLANEOUS)
RTRCTR WOUND ALEXIS 25CM LRG (MISCELLANEOUS) ×3
SPONGE GAUZE 2X2 8PLY STRL LF (GAUZE/BANDAGES/DRESSINGS) ×1 IMPLANT
SPONGE LAP 18X18 X RAY DECT (DISPOSABLE) ×6 IMPLANT
SPONGE SURGIFOAM ABS GEL 12-7 (HEMOSTASIS) ×1 IMPLANT
STAPLER VISISTAT 35W (STAPLE) ×3 IMPLANT
STRIP CLOSURE SKIN 1/2X4 (GAUZE/BANDAGES/DRESSINGS) ×1 IMPLANT
SUT MNCRL AB 3-0 PS2 27 (SUTURE) ×1 IMPLANT
SUT PLAIN 2 0 XLH (SUTURE) ×1 IMPLANT
SUT PLAIN 3 0 CT 1 27 (SUTURE) IMPLANT
SUT PROLENE 3 0 SH DA (SUTURE) IMPLANT
SUT VIC AB 0 CT1 18XCR BRD8 (SUTURE) IMPLANT
SUT VIC AB 0 CT1 27 (SUTURE) ×15
SUT VIC AB 0 CT1 27XBRD ANBCTR (SUTURE) ×8 IMPLANT
SUT VIC AB 0 CT1 8-18 (SUTURE) ×9
SUT VIC AB 2-0 SH 27 (SUTURE)
SUT VIC AB 2-0 SH 27XBRD (SUTURE) IMPLANT
SUT VIC AB 3-0 SH 27 (SUTURE) ×6
SUT VIC AB 3-0 SH 27X BRD (SUTURE) IMPLANT
SUT VICRYL #0 CTB 1 (SUTURE) ×9 IMPLANT
SUT VICRYL 0 TIES 12 18 (SUTURE) ×3 IMPLANT
SUT VICRYL 1 TIES 12X18 (SUTURE) IMPLANT
SUT VICRYL 3 0 BR 18  UND (SUTURE)
SUT VICRYL 3 0 BR 18 UND (SUTURE) IMPLANT
SYR CONTROL 10ML LL (SYRINGE) ×3 IMPLANT
TOWEL OR 17X24 6PK STRL BLUE (TOWEL DISPOSABLE) ×9 IMPLANT
TRAY FOLEY CATH 14FR (SET/KITS/TRAYS/PACK) ×3 IMPLANT
WATER STERILE IRR 1000ML POUR (IV SOLUTION) ×3 IMPLANT

## 2013-12-03 NOTE — Anesthesia Postprocedure Evaluation (Signed)
  Anesthesia Post-op Note  Patient: Sara Hickman  Procedure(s) Performed: Procedure(s): HYSTERECTOMY ABDOMINAL (N/A) BILATERAL SALPINGECTOMY (Bilateral)  Patient Location: PACU  Anesthesia Type:General  Level of Consciousness: awake, alert  and oriented  Airway and Oxygen Therapy: Patient Spontanous Breathing  Post-op Pain: mild  Post-op Assessment: Post-op Vital signs reviewed, Patient's Cardiovascular Status Stable, Respiratory Function Stable, Patent Airway, No signs of Nausea or vomiting, Adequate PO intake and Pain level controlled  Post-op Vital Signs: Reviewed and stable  Complications: No apparent anesthesia complications

## 2013-12-03 NOTE — H&P (View-Only) (Signed)
Sara Hickman is a 42 y.o. female P 1-0-0-1 for hysterectomy because of symptomatic uterine fibroids. Over the past year and a half the patient observed increase abdominal girth, a feeling of fullness and increase urinary frequency. After evaluation she was found to have uterine fibroids. She reports a menstrual flow that lasts 4 days and a need to change her pad every 2 hours. On occasion she will experience her menses a week earlier than expected but no intermenstrual bleeding, post coital spotting, dyspareunia or pelvic pain. During her period she reports cramping that is rated 5/10 on a 10 point pain scale. A CBC in April 2014 was normal and pelvic ultrasound July 2014 showed: uterus: 18.4 x 12.2 x 12.1 cm, large subserosal fundal fibroid, 12.3 x 11.1 x 12.6 cm; left ovary-3.73 x 2.25 x 1.89 cm and right ovary-3.25 x 2.21 x 1.61cm.  After being on Lupron Depot for 3 months, a follow up MRI on 11/28/2013 showed  a retroverted uterus that was not measured, noted were numerous uterine fibroids with none that were submucosal but endometrial stripe was normal.  The largest fibroid was projecting off anterior aspect of myometrium, largely exophytic 11.2 x 11.0 x 12.5 cm;  the second largest projects off posterior myometrium and into the cul-de-sac measuring 7.0 x 6.4 x 6.3 cm;  other smaller fibroids noted.  All fibroids demonstrated some degree of enhancement post contrast though large fibroid demonstrates some areas of probable degeneration/necrosis;  both ovaries appear normal as does cervix;  no pelvic adenopathy or significant free fluid collection. A review of medical and surgical management options were given to the patient however she wants to pursue definitive therapy in the form of hysterectomy.  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 CVS GARLIC ODORLESS TABS Take 1 tablet by mouth daily.  . Cyanocobalamin (VITAMIN B12 PO) Take 1,000 mcg by mouth daily.   . fish oil-omega-3 fatty acids 1000 MG capsule Take 1 g by mouth daily.   . Multiple Vitamin (MULITIVITAMIN WITH MINERALS) TABS Take 1 tablet by mouth daily.  . ranitidine (ZANTAC) 150 MG tablet Take 1 tablet (150 mg total) by mouth 2 (two) times daily.  Marland Kitchen VITAMIN D, CHOLECALCIFEROL, PO Take 1,000 Units by mouth daily.    Denies sensitivity to peanuts, shellfish, soy, latex or adhesives.  No Known Allergies  ROS: Denies corrective lenses,  headache, vision changes, nasal congestion, dysphagia, tinnitus, dizziness, hoarseness, cough,  chest pain, shortness of breath, nausea, vomiting, diarrhea,constipation,  urinary frequency, urgency  dysuria, hematuria, vaginitis symptoms, pelvic pain, swelling of joints,easy bruising,  myalgias, arthralgias, skin rashes, unexplained weight loss and except as is mentioned in the history of present illness, patient's review of systems is otherwise negative.  Physical Exam  Bp: 130/86     P: 86    R: 12   Temperature: 98.2 degrees F orally    Weight: 249.5 lbs.  Height: 5\' 9"    BMI: 36.8  Neck: supple without masses or  thyromegaly Lungs: clear to auscultation Heart: regular rate and rhythm Abdomen: soft, non-tender mass arising from the pelvis to the level of umbilicus and no organomegaly Pelvic:EGBUS- wnl; vagina-normal rugae; uterus-irregular and enlarged to approximately 20-22 weeks size;  cervix-not visualized due to uterine position adnexae-obscured by mass effect of uterus Extremities:  no  clubbing, cyanosis or edema  Endometrial Biopsy: precluded due to acute displacement of the cervix  Assesment:  Large Symptomatic Uterine Fibroids   Disposition:  A discussion was held with patient regarding the indication for her procedure(s) along with the risks, which include but are not limited to: reaction to anesthesia, damage to adjacent organs, infection, pelvic prolapse and excessive bleeding.  The patient verbalized understanding of these risks and has consented to proceed with a Total Abdominal Hysterectomy with Bilateral Salpingectomy at  [facility] on December 03, 2013 at 7:30 a.m.   CSN# [id number]   Muadh Creasy J. Lowell Guitar, PA-C  for Dr.  Crist Fat. Rivard

## 2013-12-03 NOTE — Transfer of Care (Signed)
Immediate Anesthesia Transfer of Care Note  Patient: Sara Hickman  Procedure(s) Performed: Procedure(s): HYSTERECTOMY ABDOMINAL (N/A) BILATERAL SALPINGECTOMY (Bilateral)  Patient Location: PACU  Anesthesia Type:General  Level of Consciousness: awake, alert  and oriented  Airway & Oxygen Therapy: Patient Spontanous Breathing and Patient connected to nasal cannula oxygen  Post-op Assessment: Report given to PACU RN, Post -op Vital signs reviewed and stable and Patient moving all extremities  Post vital signs: Reviewed and stable  Complications: No apparent anesthesia complications

## 2013-12-03 NOTE — Op Note (Signed)
Preoperative diagnosis: Symptomatic uterine fibroids  Postoperative diagnosis: Same  Anesthesia: Gen.  Anesthesiologist: Dr. Mal Amabile  Procedure: Total abdominal hysterectomy with bilateral salpingectomy  Surgeon: Dr. Dois Davenport Nyaira Hodgens  Assistant: Henreitta Leber P.A.-C.  Estimated blood loss: 75 cc  Procedure:  After being informed of the planned procedure with possible complications including but not limited to bleeding, infection, injury to other organs, informed consent was obtained and patient was taken to or #7. She was given general anesthesia with endotracheal intubation without any complication. She was placed in the dorsal decubitus position with knee-high sequential compressive devices. A Foley catheter was inserted in her bladder. She was prepped and draped in a sterile fashion.  We infiltrate the abdominal wall using Marcaine 0.25 from the umbilicus to the symphysis pubic. We performed a midline incision with knife which is brought down sharply to the fascia. The fascia is incised first with knife then with scissors. Linea alba is dissected. Peritoneum is entered bluntly. A large Alexis retractor is easily positioned and the bowels are retracted with lap sponges.  Observation: Enlarged uterus approximately 22 weeks in size with a large posterior lower uterine segment pedunculated fibroid measuring 8 cm. Both tubes and ovaries are normal. Posterior cul-de-sac is normal.  The uterus is mobilized out of the pelvic cavity using a corkscrew handle. Both round ligaments are isolated, sectioned and sutured with a transfix suture of 0 Vicryl. This gives [place] entry into the broad ligament which was sharply dissected across the lower uterine segment. The utero-ovarian ligament with its tube is isolated on both sides, sectioned and sutured first with a transfix suture of 0 Vicryl then with a free tie of 0 Vicryl. The bladder is then dissected bluntly and sharply away from the anterior vaginal  wall. Both ureters are located. Uterine vessels are then clamped, sectioned and doubly sutured with 0 Vicryl. Cardinal ligaments are then isolated on each side in 3 passes using a straight Rogers forcep. Those ligaments are sectioned and sutured with a transfixed sutures of 0 Vicryl. Confirming bladder dissection is below the cervical vaginal junction, we now isolate the vaginal angles on each side using a curved Rogers clamp. The vagina is opened and the uterus is removed  with its cervix. Each vaginal angle is sutured with a transfix suture of 0 Vicryl. The vaginal cuff is then sutured with figure-of-eight stitches of 0 Vicryl. Hemostasis is completed on the right angle of the cuff with 2 figure-of-eight stitches of 0 Vicryl. Hemostasis is also completed on the bladder dissection using cauterization and Gelfoam. We then proceed with bilateral salpingectomy by isolating the mesosalpinx on a Kelly forceps and removing the tube and entirely on both sides. Those pedicles are then sutured with transfixed sutures of 0 Vicryl. We proceed with profusely irrigation of the pelvis using warm saline and complete hemostasis on peritoneal edges using cauterization. We then confirm a satisfactory hemostasis on all pedicles and vaginal cuff. Both ureters are visualized and show normal peristaltic activity and no dilation.  Retractors and sponges are removed. Under fascia hemostasis is completed with cauterization. The fascia is closed with 2 running sutures of 0 Vicryl meeting midline. The wound is irrigated with warm saline and hemostasis is completed with cauterization. A On-Q system is placed above the fascia per protocol. 3 stitches of 0 plain are placed in the fat above the fascia. The skin is closed with a subcuticular suture of 3-0 Monocryl and Steri-Strips.  Instrument and sponge count is complete x2. Estimated blood loss  is 75 cc. The procedure is well tolerated by the patient is taken to recovery room in a well and  stable condition.  Specimen: Uterus and 2 tubes weighing 1564 g sent to pathology

## 2013-12-03 NOTE — Discharge Summary (Signed)
Physician Discharge Summary  Patient ID: Sara Hickman MRN: [id number] DOB/AGE: May 20, 1971 42 y.o.  Admit date: 12/03/2013 Discharge date: 12/03/2013  Admission Diagnoses: Symptomatic uterine fibroids  Discharge Diagnoses: same with newly diagnosed HTN started on Procardia XL 60 mg daily      Discharged Condition: good  Hospital Course:   Treatments:  Total abdominal hysterectomy with bilateral salpyngectomy 12/03/13  Disposition: 01-Home or Self Care     Medication List    ASK your doctor about these medications       [employer] GARLIC ODORLESS Tabs  Take 1 tablet by mouth daily.     fish oil-omega-3 fatty acids 1000 MG capsule  Take 1 g by mouth daily.     multivitamin with minerals Tabs tablet  Take 1 tablet by mouth daily.     ranitidine 150 MG tablet  Commonly known as:  ZANTAC  Take 1 tablet (150 mg total) by mouth 2 (two) times daily.     VITAMIN B12 PO  Take 1,000 mcg by mouth daily.     VITAMIN D (CHOLECALCIFEROL) PO  Take 1,000 Units by mouth daily.         Signed: Esmeralda Arthur, MD 12/03/2013, 10:23 AM

## 2013-12-03 NOTE — Anesthesia Postprocedure Evaluation (Signed)
  Anesthesia Post-op Note  Patient: Sara Hickman  Procedure(s) Performed: Procedure(s): HYSTERECTOMY ABDOMINAL (N/A) BILATERAL SALPINGECTOMY (Bilateral)  Patient Location: [facility]  Anesthesia Type:General  Level of Consciousness: awake  Airway and Oxygen Therapy: Patient Spontanous Breathing  Post-op Pain: mild  Post-op Assessment: Patient's Cardiovascular Status Stable and Respiratory Function Stable  Post-op Vital Signs: stable  Complications: No apparent anesthesia complications

## 2013-12-03 NOTE — Preoperative (Signed)
Beta Blockers   Reason not to administer Beta Blockers:Not Applicable 

## 2013-12-03 NOTE — Interval H&P Note (Signed)
History and Physical Interval Note:  12/03/2013 7:29 AM  Sara Hickman  has presented today for surgery, with the diagnosis of Large Fibroids  The various methods of treatment have been discussed with the patient and family. After consideration of risks, benefits and other options for treatment, the patient has consented to  Procedure(s): HYSTERECTOMY ABDOMINAL (N/A) BILATERAL SALPINGECTOMY (N/A) as a surgical intervention .  The patient's history has been reviewed, patient examined, no change in status, stable for surgery.  I have reviewed the patient's chart and labs.  Questions were answered to the patient's satisfaction.     [name]

## 2013-12-03 NOTE — Anesthesia Preprocedure Evaluation (Signed)
Anesthesia Evaluation  Patient identified by MRN, date of birth, ID band Patient awake    Reviewed: Allergy & Precautions, H&P , NPO status , Patient's Chart, lab work & pertinent test results, reviewed documented beta blocker date and time   History of Anesthesia Complications Negative for: history of anesthetic complications  Airway Mallampati: III TM Distance: >3 FB Neck ROM: full    Dental  (+) Teeth Intact   Pulmonary neg pulmonary ROS, Recent URI  (felt like she was catching a cold, symptoms resolved, no fever),  breath sounds clear to auscultation  Pulmonary exam normal       Cardiovascular Exercise Tolerance: Good negative cardio ROS  Rhythm:regular Rate:Normal     Neuro/Psych  Headaches (has a headache now), negative psych ROS   GI/Hepatic Neg liver ROS, GERD-  Medicated,  Endo/Other  Obese BMI 38.5  Renal/GU negative Renal ROS  Female GU complaint     Musculoskeletal   Abdominal   Peds  Hematology negative hematology ROS (+)   Anesthesia Other Findings   Reproductive/Obstetrics negative OB ROS                           Anesthesia Physical Anesthesia Plan  ASA: II  Anesthesia Plan: General ETT   Post-op Pain Management:    Induction:   Airway Management Planned:   Additional Equipment:   Intra-op Plan:   Post-operative Plan:   Informed Consent: I have reviewed the patients History and Physical, chart, labs and discussed the procedure including the risks, benefits and alternatives for the proposed anesthesia with the patient or authorized representative who has indicated his/her understanding and acceptance.   Dental Advisory Given  Plan Discussed with: CRNA and Surgeon  Anesthesia Plan Comments:         Anesthesia Quick Evaluation

## 2013-12-04 ENCOUNTER — Encounter (HOSPITAL_COMMUNITY): Payer: Self-pay | Admitting: Obstetrics and Gynecology

## 2013-12-04 DIAGNOSIS — IMO0001 Reserved for inherently not codable concepts without codable children: Secondary | ICD-10-CM | POA: Diagnosis not present

## 2013-12-04 LAB — CBC
Hemoglobin: 12.7 g/dL (ref 12.0–15.0)
MCH: 27.9 pg (ref 26.0–34.0)
MCHC: 33.7 g/dL (ref 30.0–36.0)

## 2013-12-04 MED ORDER — IBUPROFEN 600 MG PO TABS: ORAL_TABLET | ORAL | Status: DC

## 2013-12-04 MED ORDER — NIFEDIPINE ER 60 MG PO TB24
60.0000 mg | ORAL_TABLET | Freq: Every day | ORAL | Status: DC
  Administered 2013-12-05: 60 mg via ORAL
  Filled 2013-12-04 (×2): qty 1

## 2013-12-04 MED ORDER — ONDANSETRON HCL 4 MG PO TABS: 4.0000 mg | ORAL_TABLET | Freq: Three times a day (TID) | ORAL | Status: DC | PRN

## 2013-12-04 MED ORDER — OXYCODONE-ACETAMINOPHEN 5-325 MG PO TABS
1.0000 | ORAL_TABLET | Freq: Four times a day (QID) | ORAL | Status: DC | PRN
Start: 2013-12-04 — End: 2014-02-05

## 2013-12-04 MED ORDER — NIFEDIPINE ER 30 MG PO TB24: 30.0000 mg | ORAL_TABLET | Freq: Every day | ORAL | Status: DC

## 2013-12-04 NOTE — Discharge Summary (Signed)
Physician Discharge Summary  Patient ID: Sara Hickman MRN: [id number] DOB/AGE: [birth year] 42 y.o.  Admit date: 12/03/2013 Discharge date: 12/04/2013   Discharge Diagnoses:  Large Symptomatic Uterine Fibroids and Elevated Blood Pressure Active Problems:   Fibroids   Elevated blood pressure   Operation: Total Abdominal Hysterectomy with Bilateral Salpingectomy  Discharged Condition: Good  Hospital Course: On the date of admission the patient underwent the aforementioned procedures that included the removal of a 22 week size uterus that contained an 8 cm posterior pedunculated fibroid and tolerated them well.  Post operative course was unremarkable with the patient resuming bowel and bladder function by post operative day #1 but complained of a persistent headache. Fortunately  patient's headache responded to acetaminophen.  Post operative hemoglobin was 12. 7. Having achieved the maximum benefit of her hospital stay, the patient was discharged home on post operative day # 2.  Disposition: 01-Home or Self Care  Discharge Medications:    Medication List         [employer] GARLIC ODORLESS Tabs  Take 1 tablet by mouth daily.     fish oil-omega-3 fatty acids 1000 MG capsule  Take 1 g by mouth daily.     ibuprofen 600 MG tablet  Commonly known as:  ADVIL,MOTRIN  1 po pc every 6 hours x 5 days then prn-pain     multivitamin with minerals Tabs tablet  Take 1 tablet by mouth daily.     NIFEdipine 30 MG 24 hr tablet  Commonly known as:  PROCARDIA-XL/ADALAT CC  Take 1 tablet (30 mg total) by mouth daily.     ondansetron 4 MG tablet  Commonly known as:  ZOFRAN  Take 1 tablet (4 mg total) by mouth every 8 (eight) hours as needed for nausea or vomiting.     oxyCODONE-acetaminophen 5-325 MG per tablet  Commonly known as:  PERCOCET/ROXICET  Take 1-2 tablets by mouth every 6 (six) hours as needed for severe pain (moderate to severe pain (when tolerating fluids)).     ranitidine 150 MG  tablet  Commonly known as:  ZANTAC  Take 1 tablet (150 mg total) by mouth 2 (two) times daily.     VITAMIN B12 PO  Take 1,000 mcg by mouth daily.     VITAMIN D (CHOLECALCIFEROL) PO  Take 1,000 Units by mouth daily.         Follow-up: Dr. Dois Davenport A. Rivard in 6 weeks   Signed: Mirko Tailor, PA-C 12/04/2013, 7:59 AM

## 2013-12-04 NOTE — Progress Notes (Signed)
Sara Hickman is a23 y.o.  [id number]  Post Op Date # 1;  TAH/BS  Subjective: Patient is Doing well postoperatively. Patient has The patient is not having any pain., ambulating in the halls, voiding, tolerating regular diet and has passed flatus. Pictures of surgical specimen shown to patient and reviewed.  Objective: Vital signs in last 24 hours: Temp:  [97.3 F (36.3 C)-98.7 F (37.1 C)] 98.5 F (36.9 C) (12/12 0529) Pulse Rate:  [65-102] 102 (12/12 0529) Resp:  [16-32] 18 (12/12 0529) BP: (96-172)/(64-115) 141/93 mmHg (12/12 0529) SpO2:  [96 %-100 %] 99 % (12/12 0529) Weight:  [253 lb (114.76 kg)] 253 lb (114.76 kg) (12/11 1230)  Intake/Output from previous day: 12/11 0701 - 12/12 0700 In: 4951.3 [P.O.:720; I.V.:4031.3] Out: 6275 [Urine:6200] Intake/Output this shift:    Recent Labs Lab 12/04/13 0530  WBC 11.7*  HGB 12.7  HCT 37.7  PLT 252    No results found for this basename: NA, K, CL, CO2, BUN, CREATININE, CALCIUM, LABALBU, PROT, BILITOT, ALKPHOS, ALT, AST, GLUCOSE,  in the last 168 hours  EXAM: General: alert, cooperative and no distress Resp: clear to auscultation bilaterally Cardio: regular rate and rhythm, S1, S2 normal, no murmur, click, rub or gallop GI: Bowel sounds present; Dressing intact with dry faint quarter sized blood stain Extremities: Homans sign is negative, no sign of DVT and no calf tenderness.   Assessment: s/p Procedure(s): HYSTERECTOMY ABDOMINAL BILATERAL SALPINGECTOMY: stable and progressing well Elevated Blood Pressure  Plan: Routine care  LOS: 1 day    POWELL,ELMIRA, PA-C 12/04/2013 7:38 AM  Patient doing very well. Pain is 1/10 with Ibuprofen only. Voiding, ambulating, passing flatus. Desires D/C in am BP is improved with Procardia XL 30 mg but still 140-150/ 90s: will increase to 60 mg and patient will follow with PCP

## 2013-12-05 MED ORDER — NIFEDIPINE ER 60 MG PO TB24: 60.0000 mg | ORAL_TABLET | Freq: Every day | ORAL | Status: DC

## 2013-12-05 NOTE — Progress Notes (Addendum)
2 Days Post-Op Procedure(s) (LRB): HYSTERECTOMY ABDOMINAL (N/A) BILATERAL SALPINGECTOMY (Bilateral)   Subjective: Patient reports tolerating PO, + flatus and + BM.  Voiding spontaneously without difficulty.  Pt does c/o mild HA and has a h/o occas HAs.  She had ibuprofen this morning but nothing since and no percocet recently.  Objective: I have reviewed patient's vital signs.  Pulse 94  General: alert and no distress Resp: clear to auscultation bilaterally Cardio: regular rate and rhythm GI: soft, app tender, NABS, dressing slightly stained after removal of On-Q pump this morning Extremities: Homans sign is negative, no sign of DVT Vaginal Bleeding: none  Assessment: s/p Procedure(s): HYSTERECTOMY ABDOMINAL (N/A) BILATERAL SALPINGECTOMY (Bilateral): stable  Plan: Discharge home pending improval of HA Will try tylenol, pt about to eat now and may be hunger HA.  Also per RN she drinks caffeine regularly and has been getting nothing but the decaf coffee here.  Will try a little caffeine as well. D/C instructions reviewed and pt instructed to f/u with SR in 6wks BP controlled on procardia xl 60mg  qd, will send home with rx. (May be contributing to her HA)  LOS: 2 days    Doil Kamara Y 12/05/2013, 1:17 PM

## 2013-12-05 NOTE — Progress Notes (Signed)
On Q pump removed. Leaking from right site. Folded 2x2 applied and honeycomb dressing replaced. Patient tolerated well.

## 2013-12-05 NOTE — Progress Notes (Signed)
Patient reports headache has resolved after eating lunch.  Discharge instructions provided to patient and significant other at bedside.  Activity, medications, follow up appointments, when to call the doctor, incision care  and community resources discussed. No questions at this time.  Patient left unit in stable condition with all personal belongings and prescriptions accompanied by staff.  Osvaldo Angst, RN------------

## 2014-02-05 ENCOUNTER — Encounter: Payer: Self-pay | Admitting: Family Medicine

## 2014-02-05 ENCOUNTER — Ambulatory Visit (INDEPENDENT_AMBULATORY_CARE_PROVIDER_SITE_OTHER): Payer: 59 | Admitting: Family Medicine

## 2014-02-05 VITALS — BP 154/96 | HR 78 | Temp 97.6°F | Resp 20 | Ht 68.0 in | Wt 248.0 lb

## 2014-02-05 DIAGNOSIS — I10 Essential (primary) hypertension: Secondary | ICD-10-CM

## 2014-02-05 DIAGNOSIS — K219 Gastro-esophageal reflux disease without esophagitis: Secondary | ICD-10-CM

## 2014-02-05 NOTE — Patient Instructions (Addendum)
Take 1/2 tablet of the benicar Stop the procardia Try the nexium for acid reflux  F/U 2 weeks

## 2014-02-07 DIAGNOSIS — K219 Gastro-esophageal reflux disease without esophagitis: Secondary | ICD-10-CM | POA: Insufficient documentation

## 2014-02-07 DIAGNOSIS — I10 Essential (primary) hypertension: Secondary | ICD-10-CM | POA: Insufficient documentation

## 2014-02-07 NOTE — Assessment & Plan Note (Signed)
I think given her some samples of Nexium to try if this helps a prescription can be sENT IN

## 2014-02-07 NOTE — Progress Notes (Signed)
Patient ID: Sara Hickman, female   DOB: 1971-08-20, 43 y.o.   MRN: [id number]    Subjective:    Patient ID: Sara Hickman, female    DOB: [birth year], 43 y.o.   MRN: [id number]  Patient presents for BP issues  patient was noted to have severely elevated blood pressure while she was having her hysterectomy done. Her gynecologist started her on Procardia daily. She's not been monitoring her blood pressure on a regular basis but knows that has been. She's not had any headache or chest pain or shortness of breath. Also inquired about her Zantac her insurance would not pay for it therefore she's been taking over-the-counter Zantac but this does not help her symptoms. Her symptoms, she eats particular foods but she does have almost daily symptoms. She's not tried a proton pump inhibitor.    Review Of Systems:  GEN- denies fatigue, fever, weight loss,weakness, recent illness HEENT- denies eye drainage, change in vision, nasal discharge, CVS- denies chest pain, palpitations RESP- denies SOB, cough, wheeze ABD- denies N/V, change in stools, abd pain Neuro- denies headache, dizziness, syncope, seizure activity       Objective:    BP 154/96  Pulse 78  Temp(Src) 97.6 F (36.4 C) (Oral)  Resp 20  Ht 5\' 8"  (1.727 m)  Wt 248 lb (112.492 kg)  BMI 37.72 kg/m2 GEN- NAD, alert and oriented x3 HEENT- PERRL, EOMI, non injected sclera, pink conjunctiva, MMM, oropharynx clear CVS- RRR, no murmur RESP-CTAB EXT- No edema Pulses- Radial 2+        Assessment & Plan:      Problem List Items Addressed This Visit   GERD (gastroesophageal reflux disease)     I think given her some samples of Nexium to try if this helps a prescription can be sENT IN    Essential hypertension, benign - Primary     Her blood pressure is quite elevated. I've given her samples of Benicar HCT CT take from the office she will take 20/12.5 mg by mouth she can stop her Procardia    Relevant Medications   olmesartan-hydrochlorothiazide (BENICAR HCT) 20-12.5 MG per tablet      Note: This dictation was prepared with Dragon dictation along with smaller phrase technology. Any transcriptional errors that result from this process are unintentional.

## 2014-02-07 NOTE — Assessment & Plan Note (Signed)
Her blood pressure is quite elevated. I've given her samples of Benicar HCT CT take from the office she will take 20/12.5 mg by mouth she can stop her Procardia

## 2014-02-23 ENCOUNTER — Encounter: Payer: Self-pay | Admitting: Family Medicine

## 2014-02-23 ENCOUNTER — Ambulatory Visit (INDEPENDENT_AMBULATORY_CARE_PROVIDER_SITE_OTHER): Payer: 59 | Admitting: Family Medicine

## 2014-02-23 VITALS — BP 152/90 | HR 90 | Temp 98.3°F | Resp 22 | Ht 67.5 in | Wt 247.0 lb

## 2014-02-23 DIAGNOSIS — J069 Acute upper respiratory infection, unspecified: Secondary | ICD-10-CM | POA: Insufficient documentation

## 2014-02-23 DIAGNOSIS — I10 Essential (primary) hypertension: Secondary | ICD-10-CM

## 2014-02-23 MED ORDER — GUAIFENESIN-CODEINE 100-10 MG/5ML PO SOLN: 10.0000 mL | ORAL | Status: DC | PRN

## 2014-02-23 NOTE — Assessment & Plan Note (Signed)
Robitussin with codeine given. She's to stop all the over-the-counter medications and the decongestants because of her blood pressure

## 2014-02-23 NOTE — Patient Instructions (Signed)
Take benicar 1 whole tablet I will f/u by phone 2 weeks  Cough medicine F/U 2 months in office

## 2014-02-23 NOTE — Assessment & Plan Note (Signed)
Blood pressure is still quite elevated. I will have her take Benicar 40/25 mg one a day. She was given some samples from the office. I will followup by phone in 2 weeks and she will followup in the office in 2 months

## 2014-02-23 NOTE — Progress Notes (Signed)
Patient ID: Sara Hickman, female   DOB: [birth year], 43 y.o.   MRN: [id number]   Subjective:    Patient ID: Sara Hickman, female    DOB: November 17, 1971, 43 y.o.   MRN: [id number]  Patient presents for 2 week F/U  patient here for interim followup on her hypertension. At the last visit she was started on Benicar 20/12.5 mg once a day. She did check her blood pressure couple times at home and it was still elevated with systolic in the 9:37 to 169C and a diastolic in the 78L. She has had an upper respiratory infection the past 2 weeks and has been taking a lot of over-the-counter decongestants as well as Alka-Seltzer with some improvement. Her cough has minimal production she has not had any fever.    Review Of Systems:  GEN- denies fatigue, fever, weight loss,weakness,+ recent illness HEENT- denies eye drainage, change in vision, nasal discharge, CVS- denies chest pain, palpitations RESP- denies SOB, +cough, wheeze Neuro- denies headache, dizziness, syncope, seizure activity       Objective:    BP 152/90  Pulse 90  Temp(Src) 98.3 F (36.8 C)  Resp 22  Ht 5' 7.5" (1.715 m)  Wt 247 lb (112.038 kg)  BMI 38.09 kg/m2  LMP 09/24/2012 GEN- NAD, alert and oriented x3  Repeat 150/100 HEENT- PERRL, EOMI, non injected sclera, pink conjunctiva, MMM, oropharynx clear, TM clear bilat no effusion,  no maxillary sinus tenderness, nares clear Neck- Supple, no LAD CVS- RRR, no murmur RESP-CTAB EXT- No edema Pulses- Radial 2+        Assessment & Plan:      Problem List Items Addressed This Visit   None      Note: This dictation was prepared with Dragon dictation along with smaller phrase technology. Any transcriptional errors that result from this process are unintentional.

## 2014-03-16 ENCOUNTER — Telehealth: Payer: Self-pay | Admitting: *Deleted

## 2014-03-16 MED ORDER — OLMESARTAN MEDOXOMIL-HCTZ 40-25 MG PO TABS: 1.0000 | ORAL_TABLET | Freq: Every day | ORAL | Status: DC

## 2014-03-16 NOTE — Telephone Encounter (Signed)
Call placed to patient.   Patient states that BP has been running really normal.   Refill appropriate and filled per protocol.

## 2014-03-16 NOTE — Telephone Encounter (Signed)
Message copied by Sheral Flow on Tue Mar 16, 2014  3:07 PM ------      Message from: Vic Blackbird F      Created: Tue Mar 16, 2014  2:33 PM      Regarding: FW: f/u BENICAR AND bp       Please call pt, see what BP has been running, 2 weeks ago she was changed to Benicar 40-25mg , if doing well, go ahead and refill the prescription, document in chart BP                   ----- Message -----         From: Alycia Rossetti, MD         Sent: 03/09/2014           To: Alycia Rossetti, MD      Subject: f/u BENICAR AND bp                                              ------

## 2014-04-28 ENCOUNTER — Ambulatory Visit: Payer: 59 | Admitting: Family Medicine

## 2014-04-30 ENCOUNTER — Encounter: Payer: Self-pay | Admitting: Family Medicine

## 2014-04-30 ENCOUNTER — Ambulatory Visit (INDEPENDENT_AMBULATORY_CARE_PROVIDER_SITE_OTHER): Payer: 59 | Admitting: Family Medicine

## 2014-04-30 VITALS — BP 132/82 | HR 80 | Temp 98.1°F | Resp 16 | Ht 67.0 in | Wt 244.0 lb

## 2014-04-30 DIAGNOSIS — I10 Essential (primary) hypertension: Secondary | ICD-10-CM

## 2014-04-30 MED ORDER — LISINOPRIL-HYDROCHLOROTHIAZIDE 20-12.5 MG PO TABS: 2.0000 | ORAL_TABLET | Freq: Every day | ORAL | Status: DC

## 2014-04-30 MED ORDER — ESOMEPRAZOLE MAGNESIUM 40 MG PO CPDR: 40.0000 mg | DELAYED_RELEASE_CAPSULE | Freq: Every day | ORAL | Status: DC

## 2014-04-30 NOTE — Progress Notes (Signed)
Patient ID: Sara Hickman, female   DOB: 06/27/1971, 43 y.o.   MRN: [id number]   Subjective:    Patient ID: Sara Hickman, female    DOB: May 11, 1971, 43 y.o.   MRN: [id number]  Patient presents for 2 month F/U  Patient here to followup hypertension paralyzed is in her Benicar was increased to 40/25 mg. She's been checked her blood pressure at home has been running very good at 120s over 70s to 80s. She's not had any headaches chest pain shortness of breath. She does state the Benicar caused her $30 a month with her insurance and she would like something cheaper possible   Review Of Systems:  GEN- denies fatigue, fever, weight loss,weakness, recent illness HEENT- denies eye drainage, change in vision, nasal discharge, CVS- denies chest pain, palpitations RESP- denies SOB, cough, wheeze Neuro- denies headache, dizziness, syncope, seizure activity       Objective:    BP 132/82  Pulse 80  Temp(Src) 98.1 F (36.7 C) (Oral)  Resp 16  Ht 5\' 7"  (1.702 m)  Wt 244 lb (110.678 kg)  BMI 38.21 kg/m2  LMP 09/24/2012 GEN- NAD, alert and oriented x3 CVS- RRR, no murmur RESP-CTAB EXT- No edema Pulses- Radial 2+        Assessment & Plan:      Problem List Items Addressed This Visit   None      Note: This dictation was prepared with Dragon dictation along with smaller phrase technology. Any transcriptional errors that result from this process are unintentional.

## 2014-04-30 NOTE — Assessment & Plan Note (Signed)
I will change her to lisinopril/HCTZ 40/25 mg is the equivalent to her Benicar. Her blood pressure is much improved we will see her in 6 months, and we'll continue to monitor at home

## 2014-04-30 NOTE — Patient Instructions (Signed)
New blood pressure medication- lisinopril instead of benicar  F/U 6 months

## 2014-05-14 ENCOUNTER — Other Ambulatory Visit: Payer: Self-pay

## 2014-05-14 DIAGNOSIS — Z1231 Encounter for screening mammogram for malignant neoplasm of breast: Secondary | ICD-10-CM

## 2014-06-11 ENCOUNTER — Ambulatory Visit
Admission: RE | Admit: 2014-06-11 | Discharge: 2014-06-11 | Disposition: A | Discharge status: Home / Self Care | Payer: 59 | Source: Ambulatory Visit

## 2014-06-11 DIAGNOSIS — Z1231 Encounter for screening mammogram for malignant neoplasm of breast: Secondary | ICD-10-CM

## 2014-06-17 ENCOUNTER — Encounter: Payer: Self-pay | Admitting: Physician Assistant

## 2014-06-17 ENCOUNTER — Ambulatory Visit (INDEPENDENT_AMBULATORY_CARE_PROVIDER_SITE_OTHER): Payer: 59 | Admitting: Physician Assistant

## 2014-06-17 VITALS — BP 116/70 | HR 80 | Temp 97.9°F | Resp 20 | Wt 245.0 lb

## 2014-06-17 DIAGNOSIS — J988 Other specified respiratory disorders: Secondary | ICD-10-CM

## 2014-06-17 DIAGNOSIS — B9689 Other specified bacterial agents as the cause of diseases classified elsewhere: Principal | ICD-10-CM

## 2014-06-17 DIAGNOSIS — A499 Bacterial infection, unspecified: Secondary | ICD-10-CM

## 2014-06-17 MED ORDER — AZITHROMYCIN 250 MG PO TABS: ORAL_TABLET | ORAL | Status: DC

## 2014-06-17 NOTE — Progress Notes (Signed)
    Patient ID: Sara Hickman MRN: [id number], DOB: 1970/12/25, 43 y.o. Date of Encounter: 06/17/2014, 5:01 PM    Chief Complaint:  Chief Complaint  Patient presents with  . sick x 1 week    body aches, cough, congestion, gets dizzy when blows nose, chills     HPI: 43 y.o. year old AA female says that she has been sick for about 10 or 11 days now. Is having nasal congestion with thick dark mucus. Also is having cough productive of a lot of phlegm. Was having some sore throat the beginning but that has resolved. No ear ache. No known fevers or chills.     Home Meds:   Outpatient Prescriptions Prior to Visit  Medication Sig Dispense Refill  . Cyanocobalamin (VITAMIN B12 PO) Take 1,000 mcg by mouth daily.       . fish oil-omega-3 fatty acids 1000 MG capsule Take 1 g by mouth daily.       Marland Kitchen lisinopril-hydrochlorothiazide (PRINZIDE) 20-12.5 MG per tablet Take 2 tablets by mouth daily.  60 tablet  6  . Multiple Vitamin (MULITIVITAMIN WITH MINERALS) TABS Take 1 tablet by mouth daily.      Marland Kitchen VITAMIN D, CHOLECALCIFEROL, PO Take 1,000 Units by mouth daily.       . [facility]S GARLIC ODORLESS TABS Take 1 tablet by mouth daily.      Marland Kitchen esomeprazole (NEXIUM) 40 MG capsule Take 1 capsule (40 mg total) by mouth daily at 12 noon.  30 capsule  6   No facility-administered medications prior to visit.    Allergies: No Known Allergies    Review of Systems: See HPI for pertinent ROS. All other ROS negative.    Physical Exam: Blood pressure 116/70, pulse 80, temperature 97.9 F (36.6 C), temperature source Oral, resp. rate 20, weight 245 lb (111.131 kg), last menstrual period 09/24/2012., Body mass index is 38.36 kg/(m^2). General: Obese AAF.  Appears in no acute distress. HEENT: Normocephalic, atraumatic, eyes without discharge, sclera non-icteric, nares are without discharge. Bilateral auditory canals clear, TM's are without perforation, pearly grey and translucent with reflective cone of light  bilaterally. Oral cavity moist, posterior pharynx without exudate, erythema, peritonsillar abscess. Mild tenderness with percussion of bilateral maxillary sinuses but not of the frontal sinuses.  Neck: Supple. No thyromegaly. No lymphadenopathy. Lungs: Clear bilaterally to auscultation without wheezes, rales, or rhonchi. Breathing is unlabored. Heart: Regular rhythm. No murmurs, rubs, or gallops. Msk:  Strength and tone normal for age. Extremities/Skin: Warm and dry. Neuro: Alert and oriented X 3. Moves all extremities spontaneously. Gait is normal. CNII-XII grossly in tact. Psych:  Responds to questions appropriately with a normal affect.     ASSESSMENT AND PLAN:  43 y.o. year old female with  1. Bacterial respiratory infection - azithromycin (ZITHROMAX) 250 MG tablet; Day 1: Take 2 daily.  Days 2-5: Take 1 daily.  Dispense: 6 tablet; Refill: 0 Can continue over-the-counter decongestants and cough medicines as needed for symptom relief. Followup if symptoms do not resolve within one week after completion of antibiotic.  Marin Olp Kevil, [place], [facility] 06/17/2014 5:01 PM

## 2014-06-22 ENCOUNTER — Telehealth: Payer: Self-pay | Admitting: Family Medicine

## 2014-06-22 NOTE — Telephone Encounter (Signed)
Levaquin 750mg one po QD x 7 days.  # 7 +0.  

## 2014-06-22 NOTE — Telephone Encounter (Signed)
Has finished Z-pak.  Is not feeling any better.  What else can she do?  Same symptoms, they have not improved at all.

## 2014-06-23 MED ORDER — LEVOFLOXACIN 750 MG PO TABS: 750.0000 mg | ORAL_TABLET | Freq: Every day | ORAL | Status: DC

## 2014-06-23 NOTE — Telephone Encounter (Signed)
Rx to pharmacy.  Pt made aware.  If not better after this treatment we will need to see again.

## 2014-06-29 ENCOUNTER — Ambulatory Visit: Payer: 59 | Admitting: Family Medicine

## 2014-06-29 ENCOUNTER — Encounter: Payer: Self-pay | Admitting: Family Medicine

## 2014-06-29 ENCOUNTER — Ambulatory Visit (INDEPENDENT_AMBULATORY_CARE_PROVIDER_SITE_OTHER): Payer: 59 | Admitting: Family Medicine

## 2014-06-29 VITALS — BP 116/82 | HR 94 | Temp 98.6°F | Resp 16 | Ht 67.0 in | Wt 241.0 lb

## 2014-06-29 DIAGNOSIS — R0982 Postnasal drip: Secondary | ICD-10-CM

## 2014-06-29 DIAGNOSIS — H6092 Unspecified otitis externa, left ear: Secondary | ICD-10-CM

## 2014-06-29 DIAGNOSIS — H60399 Other infective otitis externa, unspecified ear: Secondary | ICD-10-CM

## 2014-06-29 DIAGNOSIS — J0100 Acute maxillary sinusitis, unspecified: Secondary | ICD-10-CM

## 2014-06-29 DIAGNOSIS — R05 Cough: Secondary | ICD-10-CM

## 2014-06-29 DIAGNOSIS — R059 Cough, unspecified: Secondary | ICD-10-CM

## 2014-06-29 MED ORDER — NEOMYCIN-POLYMYXIN-HC 3.5-10000-1 OT SOLN: 3.0000 [drp] | Freq: Four times a day (QID) | OTIC | Status: DC

## 2014-06-29 MED ORDER — FLUTICASONE PROPIONATE 50 MCG/ACT NA SUSP: 2.0000 | Freq: Every day | NASAL | Status: DC

## 2014-06-29 MED ORDER — GUAIFENESIN-CODEINE 100-10 MG/5ML PO SOLN: 10.0000 mL | Freq: Four times a day (QID) | ORAL | Status: DC | PRN

## 2014-06-29 MED ORDER — METHYLPREDNISOLONE (PAK) 4 MG PO TABS: ORAL_TABLET | ORAL | Status: DC

## 2014-06-29 NOTE — Patient Instructions (Addendum)
Take steroid dose pak Take cough medicine at bedtime Use mucinex during the day  F/U as previous

## 2014-06-29 NOTE — Progress Notes (Signed)
Patient ID: Sara Hickman, female   DOB: 04/10/1971, 43 y.o.   MRN: [id number]   Subjective:    Patient ID: Sara Hickman, female    DOB: 1971-05-27, 43 y.o.   MRN: [id number]  Patient presents for Cough and Otalgia  patient here with continued cough. She was seen about 2 weeks ago at that time was diagnosed with respiratory illness she was given azithromycin pack which she completed her symptoms of cough with production did not improve this she was placed on Levaquin. The cough has improved some but she still has some production is worse at night. She's not had any fever no shortness of breath no wheezing. For the past couple days she's had worsening left ear pain she feels like there is something stuck in his she cannot hear out of it very well. She's also had some sore throat accompanying this. She's tried over-the-counter medications such as Coricidin for the cough but this has not helped. She also has some sinus pressure and drainage she feels in the middle of the night.  Review Of Systems:  GEN- denies fatigue, fever, weight loss,weakness, recent illness HEENT- denies eye drainage, change in vision,+ nasal discharge, CVS- denies chest pain, palpitations RESP- denies SOB, +cough, wheeze Neuro- denies headache, dizziness, syncope, seizure activity       Objective:    BP 116/82  Pulse 94  Temp(Src) 98.6 F (37 C)  Resp 16  Ht 5\' 7"  (1.702 m)  Wt 241 lb (109.317 kg)  BMI 37.74 kg/m2  SpO2 98%  LMP 09/24/2012 GEN- NAD, alert and oriented x3 HEENT- PERRL, EOMI, non injected sclera, pink conjunctiva, MMM, oropharynx clear, TM clear bilat no effusion, + maxillary sinus tenderness, inflammed turbinates,  Nasal drainage , Right TM clear no effusion, no erythema, Left TM obsucurred by wax- TM clear no bulging, erythema in canal mild edema, Tender to manipulation Neck- Supple, no LAD CVS- RRR, no murmur RESP-CTAB EXT- No edema Pulses- Radial 2+          Assessment & Plan:       Problem List Items Addressed This Visit   None    Visit Diagnoses   Acute maxillary sinusitis, recurrence not specified    -  Primary    She status post 2 course of antibiotics. I will put her on a steroid dose pack to see if this helps with the inflammation in the drainage. I've also given her prescription given for cough medication . Note she did have some mild wax impaction on the left side we did try to flush this year however it made her dizzy therefore was discontinued     Relevant Medications       methylPREDNIsolone (MEDROL DOSPACK) 4 MG tablet       guaiFENesin-codeine 100-10 MG/5ML syrup       fluticasone (FLONASE) 50 MCG nasal spray    Post-nasal drip        Trial of Flonase after steroid dose pack. Cough medication given    Cough        I think cough is secondary to one post respiratory illness as well as the postnasal drip       Note: This dictation was prepared with Dragon dictation along with smaller phrase technology. Any transcriptional errors that result from this process are unintentional.

## 2014-06-30 ENCOUNTER — Ambulatory Visit: Payer: 59 | Admitting: Family Medicine

## 2014-08-23 ENCOUNTER — Ambulatory Visit: Payer: 59 | Admitting: Family Medicine

## 2014-10-08 ENCOUNTER — Other Ambulatory Visit: Payer: Self-pay

## 2014-10-25 ENCOUNTER — Encounter: Payer: Self-pay | Admitting: Family Medicine

## 2014-11-03 ENCOUNTER — Encounter: Payer: Self-pay | Admitting: Family Medicine

## 2014-11-03 ENCOUNTER — Ambulatory Visit (INDEPENDENT_AMBULATORY_CARE_PROVIDER_SITE_OTHER): Payer: 59 | Admitting: Family Medicine

## 2014-11-03 VITALS — BP 138/82 | HR 78 | Temp 98.6°F | Resp 14 | Ht 67.0 in | Wt 245.0 lb

## 2014-11-03 DIAGNOSIS — E669 Obesity, unspecified: Secondary | ICD-10-CM

## 2014-11-03 DIAGNOSIS — J209 Acute bronchitis, unspecified: Secondary | ICD-10-CM

## 2014-11-03 DIAGNOSIS — I10 Essential (primary) hypertension: Secondary | ICD-10-CM

## 2014-11-03 MED ORDER — PHENTERMINE HCL 37.5 MG PO TABS: 37.5000 mg | ORAL_TABLET | Freq: Every day | ORAL | Status: DC

## 2014-11-03 MED ORDER — AZITHROMYCIN 250 MG PO TABS: ORAL_TABLET | ORAL | Status: DC

## 2014-11-03 MED ORDER — GUAIFENESIN-CODEINE 100-10 MG/5ML PO SOLN: 10.0000 mL | Freq: Four times a day (QID) | ORAL | Status: DC | PRN

## 2014-11-03 MED ORDER — LISINOPRIL-HYDROCHLOROTHIAZIDE 20-12.5 MG PO TABS: 2.0000 | ORAL_TABLET | Freq: Every day | ORAL | Status: DC

## 2014-11-03 NOTE — Patient Instructions (Addendum)
Fax me a copy of your fasting labs  Try the phentermine take 1/2 tablet once a day for 2 weeks then increase to 1 tablet  Take mucinex DM during the day F/U in 8 weeks

## 2014-11-03 NOTE — Assessment & Plan Note (Signed)
Well controlled, she had fasting labs at work she will get me a copy of these

## 2014-11-03 NOTE — Progress Notes (Signed)
Patient ID: Sara Hickman, female   DOB: 1971-06-23, 43 y.o.   MRN: [id number]   Subjective:    Patient ID: Sara Hickman, female    DOB: Feb 12, 1971, 43 y.o.   MRN: [id number]  Patient presents for 6 month F/U and Illness patient to follow chronic medical problems. She is tolerating her blood pressure medication without any difficulty. She states she did check it had a few low blood pressures therefore would only taking 1 tablet that day.  She concerned about her weight she's been trying to watch what she would like to try a diet pill to help with her appetite. She has joined a gym recently.  Cough she's had intermittent episodes of cough with congestion for the past 3 months. This started a prostate week ago when her husband was hospitalized. She's had cough with a significant amount of sputum produced no fever associated no shortness of breath she also has a tickle in her throat. She's tried taking some over-the-counter Delsym with minimal improvement. She does not recall any change of the cough in relation to when I switched her over to lisinopril which was about 5 months ago.    Review Of Systems:  GEN- denies fatigue, fever, weight loss,weakness, recent illness HEENT- denies eye drainage, change in vision, nasal discharge, CVS- denies chest pain, palpitations RESP- denies SOB, +cough, wheeze ABD- denies N/V, change in stools, abd pain GU- denies dysuria, hematuria, dribbling, incontinence MSK- denies joint pain, muscle aches, injury Neuro- denies headache, dizziness, syncope, seizure activity       Objective:    BP 138/82 mmHg  Pulse 78  Temp(Src) 98.6 F (37 C) (Oral)  Resp 14  Ht 5\' 7"  (1.702 m)  Wt 245 lb (111.131 kg)  BMI 38.36 kg/m2  LMP 09/24/2012 GEN- NAD, alert and oriented x3 HEENT- PERRL, EOMI, non injected sclera, pink conjunctiva, MMM, oropharynx clear,nares clear, mild maxillary sinus pressure Neck- Supple, no LAD CVS- RRR, no murmur RESP-CTAB, normal WOB EXT-  No edema Pulses- Radial 2+        Assessment & Plan:      Problem List Items Addressed This Visit    Obesity   Relevant Medications      phentermine (ADIPEX-P) 37.5 MG tablet   Essential hypertension, benign   Relevant Medications      lisinopril-hydrochlorothiazide (PRINZIDE) 20-12.5 MG per tablet    Other Visit Diagnoses    Acute bronchitis, unspecified organism    -  Primary    Treat for bronchitis with productive cough likely not ACE related, robitussin with codiene add zpak, CXR if not better       Note: This dictation was prepared with Dragon dictation along with smaller phrase technology. Any transcriptional errors that result from this process are unintentional.

## 2014-11-03 NOTE — Assessment & Plan Note (Signed)
Obesity with associated hypertension. She is at risk for coronary artery disease. We'll start her on phentermine will start with half a tablet once a day and increase to 1 tablet we discussed the side effects of the medication as well as low-carb diet and regular exercise. She will follow up in 8 weeks.  The goal of the medication at least 10% of body weight off

## 2015-04-08 ENCOUNTER — Encounter: Payer: Self-pay | Admitting: Family Medicine

## 2015-04-08 ENCOUNTER — Ambulatory Visit (INDEPENDENT_AMBULATORY_CARE_PROVIDER_SITE_OTHER): Payer: BLUE CROSS/BLUE SHIELD | Admitting: Family Medicine

## 2015-04-08 VITALS — BP 130/64 | HR 78 | Temp 98.3°F | Resp 16 | Ht 67.0 in | Wt 233.0 lb

## 2015-04-08 DIAGNOSIS — E669 Obesity, unspecified: Secondary | ICD-10-CM

## 2015-04-08 DIAGNOSIS — I10 Essential (primary) hypertension: Secondary | ICD-10-CM | POA: Diagnosis not present

## 2015-04-08 MED ORDER — PHENTERMINE HCL 37.5 MG PO TABS: 37.5000 mg | ORAL_TABLET | Freq: Every day | ORAL | Status: DC

## 2015-04-08 MED ORDER — LISINOPRIL-HYDROCHLOROTHIAZIDE 20-12.5 MG PO TABS: 2.0000 | ORAL_TABLET | Freq: Every day | ORAL | Status: DC

## 2015-04-08 NOTE — Progress Notes (Signed)
Patient ID: Sara Hickman, female   DOB: 1971/10/06, 44 y.o.   MRN: [id number]   Subjective:    Patient ID: Sara Hickman, female    DOB: 04-02-1971, 44 y.o.   MRN: [id number]  Patient presents for Medication Review/ Refill patient follow-up medications. She's been on phentermine for the past 3 months she has lost 15 pounds. She's not had any difficulties with the medications to side effects. She is also taking her blood pressure medicine as prescribed. She has no concerns today. She is using her fit bed to walk and trying to obtain 10,000 steps a day.she is lost 2 pants size is states that she feels much better    Review Of Systems:  GEN- denies fatigue, fever, weight loss,weakness, recent illness HEENT- denies eye drainage, change in vision, nasal discharge, CVS- denies chest pain, palpitations RESP- denies SOB, cough, wheeze ABD- denies N/V, change in stools, abd pain GU- denies dysuria, hematuria, dribbling, incontinence MSK- denies joint pain, muscle aches, injury Neuro- denies headache, dizziness, syncope, seizure activity       Objective:    BP 130/64 mmHg  Pulse 78  Temp(Src) 98.3 F (36.8 C) (Oral)  Resp 16  Ht 5\' 7"  (1.702 m)  Wt 233 lb (105.688 kg)  BMI 36.48 kg/m2  LMP 09/24/2012 GEN- NAD, alert and oriented x3 CVS- RRR, no murmur RESP-CTAB EXT- No edema Pulses- Radial  2+        Assessment & Plan:      Problem List Items Addressed This Visit    Obesity   Relevant Medications   phentermine (ADIPEX-P) 37.5 MG tablet   Essential hypertension, benign - Primary   Relevant Medications   lisinopril-hydrochlorothiazide (PRINZIDE) 20-12.5 MG per tablet      Note: This dictation was prepared with Dragon dictation along with smaller phrase technology. Any transcriptional errors that result from this process are unintentional.

## 2015-04-08 NOTE — Patient Instructions (Signed)
Continue current medication Fax me your labs F/U 3 months for weight

## 2015-04-10 ENCOUNTER — Encounter: Payer: Self-pay | Admitting: Family Medicine

## 2015-04-10 NOTE — Assessment & Plan Note (Signed)
Doing well with phentermine, will continue for another 3 months, continue with healthy eating and aerobic exercise

## 2015-04-10 NOTE — Assessment & Plan Note (Signed)
Well controlled, no change to meds 

## 2015-04-18 ENCOUNTER — Encounter: Payer: Self-pay | Admitting: Family Medicine

## 2015-06-28 ENCOUNTER — Other Ambulatory Visit: Payer: Self-pay

## 2015-06-28 DIAGNOSIS — Z1231 Encounter for screening mammogram for malignant neoplasm of breast: Secondary | ICD-10-CM

## 2015-07-06 ENCOUNTER — Ambulatory Visit
Admission: RE | Admit: 2015-07-06 | Discharge: 2015-07-06 | Disposition: A | Discharge status: Home / Self Care | Payer: BLUE CROSS/BLUE SHIELD | Source: Ambulatory Visit

## 2015-07-06 DIAGNOSIS — Z1231 Encounter for screening mammogram for malignant neoplasm of breast: Secondary | ICD-10-CM

## 2015-07-08 ENCOUNTER — Ambulatory Visit: Payer: BLUE CROSS/BLUE SHIELD | Admitting: Family Medicine

## 2015-07-11 ENCOUNTER — Ambulatory Visit: Payer: BLUE CROSS/BLUE SHIELD | Admitting: Family Medicine

## 2015-11-04 ENCOUNTER — Other Ambulatory Visit: Payer: BLUE CROSS/BLUE SHIELD

## 2015-11-04 DIAGNOSIS — I10 Essential (primary) hypertension: Secondary | ICD-10-CM

## 2015-11-04 DIAGNOSIS — Z0000 Encounter for general adult medical examination without abnormal findings: Secondary | ICD-10-CM

## 2015-11-04 DIAGNOSIS — E559 Vitamin D deficiency, unspecified: Secondary | ICD-10-CM

## 2015-11-04 DIAGNOSIS — IMO0001 Reserved for inherently not codable concepts without codable children: Secondary | ICD-10-CM

## 2015-11-04 DIAGNOSIS — E539 Vitamin B deficiency, unspecified: Secondary | ICD-10-CM

## 2015-11-04 DIAGNOSIS — E669 Obesity, unspecified: Secondary | ICD-10-CM

## 2015-11-04 LAB — CBC WITH DIFFERENTIAL/PLATELET
Basophils Absolute: 0 10*3/uL (ref 0.0–0.1)
Basophils Relative: 0 % (ref 0–1)
Eosinophils Absolute: 0.1 10*3/uL (ref 0.0–0.7)
Eosinophils Relative: 1 % (ref 0–5)
HEMATOCRIT: 37.7 % (ref 36.0–46.0)
HEMOGLOBIN: 12.8 g/dL (ref 12.0–15.0)
LYMPHS ABS: 3.1 10*3/uL (ref 0.7–4.0)
LYMPHS PCT: 39 % (ref 12–46)
MCH: 28.8 pg (ref 26.0–34.0)
MCHC: 34 g/dL (ref 30.0–36.0)
MCV: 84.7 fL (ref 78.0–100.0)
MPV: 8.3 fL — AB (ref 8.6–12.4)
Monocytes Absolute: 0.6 10*3/uL (ref 0.1–1.0)
Monocytes Relative: 7 % (ref 3–12)
NEUTROS ABS: 4.2 10*3/uL (ref 1.7–7.7)
NEUTROS PCT: 53 % (ref 43–77)
Platelets: 306 10*3/uL (ref 150–400)
RBC: 4.45 MIL/uL (ref 3.87–5.11)
RDW: 13 % (ref 11.5–15.5)
WBC: 8 10*3/uL (ref 4.0–10.5)

## 2015-11-04 LAB — COMPLETE METABOLIC PANEL WITH GFR
ALBUMIN: 4 g/dL (ref 3.6–5.1)
ALK PHOS: 89 U/L (ref 33–115)
ALT: 14 U/L (ref 6–29)
AST: 14 U/L (ref 10–30)
BUN: 16 mg/dL (ref 7–25)
CALCIUM: 9 mg/dL (ref 8.6–10.2)
CO2: 27 mmol/L (ref 20–31)
Chloride: 104 mmol/L (ref 98–110)
Creat: 0.89 mg/dL (ref 0.50–1.10)
GFR, Est African American: >89 mL/min (ref >=60)
GFR, Est Non African American: 79 mL/min (ref >=60)
Glucose, Bld: 79 mg/dL (ref 70–99)
POTASSIUM: 4.1 mmol/L (ref 3.5–5.3)
SODIUM: 143 mmol/L (ref 135–146)
Total Bilirubin: 0.5 mg/dL (ref 0.2–1.2)
Total Protein: 6.9 g/dL (ref 6.1–8.1)

## 2015-11-04 LAB — VITAMIN B12: VITAMIN B 12: 1752 pg/mL — AB (ref 211–911)

## 2015-11-04 LAB — TSH: TSH: 1.383 u[IU]/mL (ref 0.350–4.500)

## 2015-11-04 LAB — LIPID PANEL
CHOL/HDL RATIO: 2.8 ratio (ref <=5.0)
CHOLESTEROL: 167 mg/dL (ref 125–200)
HDL: 59 mg/dL (ref >=46)
LDL Cholesterol: 96 mg/dL (ref <130)
Triglycerides: 60 mg/dL (ref <150)
VLDL: 12 mg/dL (ref <30)

## 2015-11-05 LAB — VITAMIN D 25 HYDROXY (VIT D DEFICIENCY, FRACTURES): VIT D 25 HYDROXY: 40 ng/mL (ref 30–100)

## 2015-11-09 ENCOUNTER — Encounter: Payer: Self-pay | Admitting: Family Medicine

## 2015-11-09 ENCOUNTER — Ambulatory Visit (INDEPENDENT_AMBULATORY_CARE_PROVIDER_SITE_OTHER): Payer: BLUE CROSS/BLUE SHIELD | Admitting: Family Medicine

## 2015-11-09 VITALS — BP 118/80 | HR 70 | Temp 98.2°F | Resp 18 | Ht 68.0 in | Wt 242.0 lb

## 2015-11-09 DIAGNOSIS — Z23 Encounter for immunization: Secondary | ICD-10-CM | POA: Diagnosis not present

## 2015-11-09 DIAGNOSIS — J309 Allergic rhinitis, unspecified: Secondary | ICD-10-CM | POA: Diagnosis not present

## 2015-11-09 DIAGNOSIS — Z0000 Encounter for general adult medical examination without abnormal findings: Secondary | ICD-10-CM

## 2015-11-09 DIAGNOSIS — I10 Essential (primary) hypertension: Secondary | ICD-10-CM

## 2015-11-09 DIAGNOSIS — E669 Obesity, unspecified: Secondary | ICD-10-CM

## 2015-11-09 MED ORDER — PHENTERMINE HCL 37.5 MG PO TABS: 37.5000 mg | ORAL_TABLET | Freq: Every day | ORAL | Status: DC

## 2015-11-09 MED ORDER — LISINOPRIL-HYDROCHLOROTHIAZIDE 20-12.5 MG PO TABS: 2.0000 | ORAL_TABLET | Freq: Every day | ORAL | Status: DC

## 2015-11-09 MED ORDER — FLUTICASONE PROPIONATE 50 MCG/ACT NA SUSP: 2.0000 | Freq: Every day | NASAL | Status: DC

## 2015-11-09 NOTE — Assessment & Plan Note (Signed)
Flonase prn

## 2015-11-09 NOTE — Assessment & Plan Note (Signed)
Her blood pressure looks good today. I would have her monitor at home if she notices she is getting low readings that she will decrease to 1 tablet.

## 2015-11-09 NOTE — Patient Instructions (Signed)
Restart phentermine  Monitor the blood pressure, decrease to 1 tablet if staying < 120/80  Take no more than 1039mcg of B12 in your vitamins Flonase for the drainage  F/U 4 months

## 2015-11-09 NOTE — Progress Notes (Signed)
Patient ID: Sara Hickman, female   DOB: 1971-03-22, 44 y.o.   MRN: [id number]      Subjective:    Patient ID: Sara Hickman, female    DOB: [birth year], 44 y.o.   MRN: [id number]  Patient presents for Annual Exam  no specific concerns today. She would leak to restart phentermine for her weight loss. She has gained about 7 pounds back. She has been off the medicine since July.  She had an episode at church about a week ago she did not eat anything where she felt like she was on a pass out she fell a week. After sitting down episode this slowly resolved. She did check her blood pressure was a little bit low. She's been checking at home and has not had any positive blood pressure otherwise.  She is followed by GYN mammogram is up-to-date.  Due for flu shot and tetanus booster.  Fasting labs reviewed. Next  Medications reviewed in detail.    Review Of Systems:  GEN- denies fatigue, fever, weight loss,weakness, recent illness HEENT- denies eye drainage, change in vision, nasal discharge, CVS- denies chest pain, palpitations RESP- denies SOB, cough, wheeze ABD- denies N/V, change in stools, abd pain GU- denies dysuria, hematuria, dribbling, incontinence MSK- denies joint pain, muscle aches, injury Neuro- denies headache, dizziness, syncope, seizure activity       Objective:    BP 118/80 mmHg  Pulse 70  Temp(Src) 98.2 F (36.8 C) (Oral)  Resp 18  Ht 5\' 8"  (1.727 m)  Wt 242 lb (109.77 kg)  BMI 36.80 kg/m2  LMP 09/24/2012 GEN- NAD, alert and oriented x3 HEENT- PERRL, EOMI, non injected sclera, pink conjunctiva, MMM, oropharynx clear Neck- Supple, no thyromegaly CVS- RRR, no murmur RESP-CTAB ABD-NABS,soft,NT,ND EXT- No edema Pulses- Radial, DP- 2+        Assessment & Plan:      Problem List Items Addressed This Visit    Obesity    Restart phentermine. Repeat her fasting last cholesterol glucose are normal. She is taking a B-12 supplement and her levels were a little  elevated to decrease this if she is only taking a total of 1000 g once a day      Relevant Medications   phentermine (ADIPEX-P) 37.5 MG tablet   Essential hypertension, benign - Primary    Her blood pressure looks good today. I would have her monitor at home if she notices she is getting low readings that she will decrease to 1 tablet.      Relevant Medications   lisinopril-hydrochlorothiazide (PRINZIDE,ZESTORETIC) 20-12.5 MG tablet   Allergic rhinitis    Flonase prn       Other Visit Diagnoses    Need for immunization against influenza        Relevant Orders    Flu Vaccine QUAD 36+ mos PF IM (Fluarix & Fluzone Quad PF) (Completed)    Need for vaccination        Relevant Orders    Tdap vaccine greater than or equal to 7yo IM (Completed)    Routine general medical examination at a health care facility        CPE done, no PAP s/p hysterectomy, Mammo UTD, FLu shot and TDAP done       Note: This dictation was prepared with Dragon dictation along with smaller phrase technology. Any transcriptional errors that result from this process are unintentional.

## 2015-11-09 NOTE — Assessment & Plan Note (Signed)
Restart phentermine. Repeat her fasting last cholesterol glucose are normal. She is taking a B-12 supplement and her levels were a little elevated to decrease this if she is only taking a total of 1000 g once a day

## 2016-03-09 ENCOUNTER — Ambulatory Visit: Payer: BLUE CROSS/BLUE SHIELD | Admitting: Family Medicine

## 2016-04-20 ENCOUNTER — Encounter: Payer: Self-pay | Admitting: Family Medicine

## 2016-04-20 ENCOUNTER — Ambulatory Visit (INDEPENDENT_AMBULATORY_CARE_PROVIDER_SITE_OTHER): Payer: BLUE CROSS/BLUE SHIELD | Admitting: Family Medicine

## 2016-04-20 VITALS — BP 118/64 | HR 70 | Temp 98.5°F | Resp 12 | Ht 68.0 in | Wt 227.0 lb

## 2016-04-20 DIAGNOSIS — I10 Essential (primary) hypertension: Secondary | ICD-10-CM | POA: Diagnosis not present

## 2016-04-20 DIAGNOSIS — E669 Obesity, unspecified: Secondary | ICD-10-CM | POA: Diagnosis not present

## 2016-04-20 MED ORDER — HYDROCHLOROTHIAZIDE 25 MG PO TABS: 25.0000 mg | ORAL_TABLET | Freq: Every day | ORAL | Status: DC

## 2016-04-20 MED ORDER — PHENTERMINE HCL 37.5 MG PO TABS: 37.5000 mg | ORAL_TABLET | Freq: Every day | ORAL | Status: DC

## 2016-04-20 NOTE — Patient Instructions (Signed)
Change to blood pressure medication to HCTZ only Continue phentermine F/U 3 months

## 2016-04-20 NOTE — Assessment & Plan Note (Signed)
Her blood pressure looks very good and it is dropping with her weight loss. I will change her to just hydrochlorothiazide 25 mg once a day.

## 2016-04-20 NOTE — Progress Notes (Signed)
Patient ID: Sara Hickman, female   DOB: January 04, 1971, 45 y.o.   MRN: [id number]   Subjective:    Patient ID: Sara Hickman, female    DOB: Aug 25, 1971, 45 y.o.   MRN: [id number]  Patient presents for Medication Review/ Refill Patient here to follow-up medications. Her weight is down 20 pounds over the past 4 months she has been on the phentermine. She will like to continue with the medication. She is working out at a [employer] and monitoring her diet. She's not had any side effects with the medication. Note she does not take the phentermine every day but at least 4-5 times a week.  She is concerned about side effects with lisinopril she is heard stories about angioedema and she had a friend pass away from allergic reaction she would like to try something different for her blood pressure. She has she states that she skips a couple days a week because her blood pressure has been running low since she lost weight.    Review Of Systems:  GEN- denies fatigue, fever, weight loss,weakness, recent illness HEENT- denies eye drainage, change in vision, nasal discharge, CVS- denies chest pain, palpitations RESP- denies SOB, cough, wheeze ABD- denies N/V, change in stools, abd pain GU- denies dysuria, hematuria, dribbling, incontinence MSK- denies joint pain, muscle aches, injury Neuro- denies headache, dizziness, syncope, seizure activity       Objective:    BP 118/64 mmHg  Pulse 70  Temp(Src) 98.5 F (36.9 C) (Oral)  Resp 12  Ht 5\' 8"  (1.727 m)  Wt 227 lb (102.967 kg)  BMI 34.52 kg/m2  LMP 09/24/2012 GEN- NAD, alert and oriented x3 HEENT- PERRL, EOMI, non injected sclera, pink conjunctiva, MMM, oropharynx clear CVS- RRR, no murmur RESP-CTAB EXT- No edema Pulses- Radial, DP- 2+        Assessment & Plan:      Problem List Items Addressed This Visit    Obesity    Continue to work on dietary changes and weight loss. We will continue the phentermine for another few months and she will  follow-up in 3 months. At that time I will plan to discontinue the diet medication      Relevant Medications   phentermine (ADIPEX-P) 37.5 MG tablet   Essential hypertension, benign - Primary    Her blood pressure looks very good and it is dropping with her weight loss. I will change her to just hydrochlorothiazide 25 mg once a day.      Relevant Medications   hydrochlorothiazide (HYDRODIURIL) 25 MG tablet      Note: This dictation was prepared with Dragon dictation along with smaller phrase technology. Any transcriptional errors that result from this process are unintentional.

## 2016-04-20 NOTE — Assessment & Plan Note (Signed)
Continue to work on dietary changes and weight loss. We will continue the phentermine for another few months and she will follow-up in 3 months. At that time I will plan to discontinue the diet medication

## 2016-08-22 ENCOUNTER — Ambulatory Visit
Admission: RE | Admit: 2016-08-22 | Discharge: 2016-08-22 | Disposition: A | Discharge status: Home / Self Care | Payer: BLUE CROSS/BLUE SHIELD | Source: Ambulatory Visit | Attending: Obstetrics and Gynecology | Admitting: Obstetrics and Gynecology

## 2016-08-22 ENCOUNTER — Other Ambulatory Visit: Payer: Self-pay | Admitting: Obstetrics and Gynecology

## 2016-08-22 DIAGNOSIS — Z1231 Encounter for screening mammogram for malignant neoplasm of breast: Secondary | ICD-10-CM

## 2016-08-28 ENCOUNTER — Other Ambulatory Visit: Payer: Self-pay | Admitting: Obstetrics and Gynecology

## 2016-08-28 DIAGNOSIS — R928 Other abnormal and inconclusive findings on diagnostic imaging of breast: Secondary | ICD-10-CM

## 2016-09-03 ENCOUNTER — Ambulatory Visit
Admission: RE | Admit: 2016-09-03 | Discharge: 2016-09-03 | Disposition: A | Discharge status: Home / Self Care | Payer: BLUE CROSS/BLUE SHIELD | Source: Ambulatory Visit | Attending: Obstetrics and Gynecology | Admitting: Obstetrics and Gynecology

## 2016-09-03 DIAGNOSIS — R928 Other abnormal and inconclusive findings on diagnostic imaging of breast: Secondary | ICD-10-CM

## 2017-03-25 ENCOUNTER — Telehealth: Payer: Self-pay | Admitting: Family Medicine

## 2017-03-25 ENCOUNTER — Encounter: Payer: Self-pay | Admitting: Family Medicine

## 2017-03-25 ENCOUNTER — Ambulatory Visit (INDEPENDENT_AMBULATORY_CARE_PROVIDER_SITE_OTHER): Payer: BLUE CROSS/BLUE SHIELD | Admitting: Family Medicine

## 2017-03-25 VITALS — BP 140/78 | HR 90 | Temp 98.2°F | Resp 14 | Ht 68.0 in | Wt 258.0 lb

## 2017-03-25 DIAGNOSIS — I10 Essential (primary) hypertension: Secondary | ICD-10-CM | POA: Diagnosis not present

## 2017-03-25 DIAGNOSIS — E6609 Other obesity due to excess calories: Secondary | ICD-10-CM

## 2017-03-25 DIAGNOSIS — J301 Allergic rhinitis due to pollen: Secondary | ICD-10-CM

## 2017-03-25 DIAGNOSIS — Z6839 Body mass index (BMI) 39.0-39.9, adult: Secondary | ICD-10-CM | POA: Diagnosis not present

## 2017-03-25 DIAGNOSIS — IMO0001 Reserved for inherently not codable concepts without codable children: Secondary | ICD-10-CM

## 2017-03-25 LAB — COMPREHENSIVE METABOLIC PANEL
ALBUMIN: 3.9 g/dL (ref 3.6–5.1)
ALT: 15 U/L (ref 6–29)
AST: 13 U/L (ref 10–35)
Alkaline Phosphatase: 92 U/L (ref 33–115)
BILIRUBIN TOTAL: 0.4 mg/dL (ref 0.2–1.2)
BUN: 16 mg/dL (ref 7–25)
CO2: 27 mmol/L (ref 20–31)
CREATININE: 0.88 mg/dL (ref 0.50–1.10)
Calcium: 9.1 mg/dL (ref 8.6–10.2)
Chloride: 101 mmol/L (ref 98–110)
GLUCOSE: 89 mg/dL (ref 70–99)
Potassium: 4.2 mmol/L (ref 3.5–5.3)
SODIUM: 138 mmol/L (ref 135–146)
Total Protein: 6.4 g/dL (ref 6.1–8.1)

## 2017-03-25 LAB — LIPID PANEL
Cholesterol: 140 mg/dL (ref <200)
HDL: 60 mg/dL (ref >50)
LDL CALC: 66 mg/dL (ref <100)
Total CHOL/HDL Ratio: 2.3 Ratio (ref <5.0)
Triglycerides: 68 mg/dL (ref <150)
VLDL: 14 mg/dL (ref <30)

## 2017-03-25 LAB — CBC WITH DIFFERENTIAL/PLATELET
BASOS PCT: 0 %
Basophils Absolute: 0 cells/uL (ref 0–200)
EOS PCT: 1 %
Eosinophils Absolute: 100 cells/uL (ref 15–500)
HCT: 39.2 % (ref 35.0–45.0)
HEMOGLOBIN: 12.9 g/dL (ref 12.0–15.0)
LYMPHS ABS: 2800 {cells}/uL (ref 850–3900)
Lymphocytes Relative: 28 %
MCH: 28.5 pg (ref 27.0–33.0)
MCHC: 32.9 g/dL (ref 32.0–36.0)
MCV: 86.5 fL (ref 80.0–100.0)
MPV: 8.7 fL (ref 7.5–12.5)
Monocytes Absolute: 800 cells/uL (ref 200–950)
Monocytes Relative: 8 %
NEUTROS PCT: 63 %
Neutro Abs: 6300 cells/uL (ref 1500–7800)
Platelets: 282 10*3/uL (ref 140–400)
RBC: 4.53 MIL/uL (ref 3.80–5.10)
RDW: 13.5 % (ref 11.0–15.0)
WBC: 10 10*3/uL (ref 3.8–10.8)

## 2017-03-25 MED ORDER — FLUTICASONE PROPIONATE 50 MCG/ACT NA SUSP
2.0000 | Freq: Every day | NASAL | 2 refills | Status: DC | PRN
Start: 2017-03-25 — End: 2018-06-19

## 2017-03-25 MED ORDER — HYDROCHLOROTHIAZIDE 25 MG PO TABS: 25.0000 mg | ORAL_TABLET | Freq: Every day | ORAL | 3 refills | Status: DC

## 2017-03-25 NOTE — Telephone Encounter (Signed)
MD please advise

## 2017-03-25 NOTE — Telephone Encounter (Signed)
We will need to check her labs first, and then make sure her BP comes down before initiating anything Low carb, low sugar diet, avoid fast food, increase water She has appt in 6 weeks we can discsuss then

## 2017-03-25 NOTE — Assessment & Plan Note (Signed)
Uncontrolled insetting of her weight gain. We'll have her take hydrochlorothiazide on an regular basis. I will check her fasting labs today. Discussed dietary changes that need to be made. We'll have her follow-up in 6 weeks for her blood pressure

## 2017-03-25 NOTE — Assessment & Plan Note (Signed)
We will start Flonase on a regular basis we'll also add in loratadine. I do not see overt signs of actual sinus infection

## 2017-03-25 NOTE — Telephone Encounter (Signed)
Call placed to patient and patient made aware.  

## 2017-03-25 NOTE — Patient Instructions (Signed)
F/U 6 weeks for blood pressure

## 2017-03-25 NOTE — Progress Notes (Signed)
   Subjective:    Patient ID: Sara Hickman, female    DOB: 1971/02/28, 46 y.o.   MRN: [id number]  Patient presents for Frequent HA (states that she is not sure if HTN or sinus infection is acusing her frequent HA)       Increased headaches over past week, Sinus pressure and some drainage as well as postnasal drip, sneezing. She uses Flonase every now and then. She has gained 30 pounds since her last visit which as been about a year ago. 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 She says her blood pressure has been running up a little bit as well as home this morning it was 130/91. She does not take the hydrochlorothiazide on a regular basis.  Review Of Systems:  GEN- denies fatigue, fever, weight loss,weakness, recent illness HEENT- denies eye drainage, change in vision, nasal discharge, CVS- denies chest pain, palpitations RESP- denies SOB, cough, wheeze ABD- denies N/V, change in stools, abd pain Neuro- + headache, dizziness, syncope, seizure activity       Objective:    BP 140/78   Pulse 90   Temp 98.2 F (36.8 C) (Oral)   Resp 14   Ht 5\' 8"  (1.727 m)   Wt 258 lb (117 kg)   LMP 09/24/2012   SpO2 98%   BMI 39.23 kg/m  GEN- NAD, alert and oriented x3, Repeat BP 140/90 HEENT- PERRL, EOMI, non injected sclera, pink conjunctiva, MMM, oropharynx clear, nares enlarged turbinates, mild maxillary sinus tenderness  Neck- Supple, no LAD  CVS- RRR, no murmur RESP-CTAB EXT- No edema Pulses- Radial 2+        Assessment & Plan:      Problem List Items Addressed This Visit    Obesity   Relevant Orders   Lipid panel   Essential hypertension, benign - Primary    Uncontrolled insetting of her weight gain. We'll have her take hydrochlorothiazide on an regular basis. I will check her fasting labs today. Discussed dietary changes that need to be made. We'll have her follow-up in 6 weeks for her blood  pressure      Relevant Medications   hydrochlorothiazide (HYDRODIURIL) 25 MG tablet   Other Relevant Orders   CBC with Differential/Platelet   Comprehensive metabolic panel   Lipid panel   TSH   Allergic rhinitis    We will start Flonase on a regular basis we'll also add in loratadine. I do not see overt signs of actual sinus infection         Note: This dictation was prepared with Dragon dictation along with smaller phrase technology. Any transcriptional errors that result from this process are unintentional.

## 2017-03-25 NOTE — Telephone Encounter (Signed)
Pt had questions about a weight loss drug and forgot to ask at her appointment please call her to advise on what the next steps are.

## 2017-03-26 LAB — TSH: TSH: 1.14 m[IU]/L

## 2017-03-29 ENCOUNTER — Encounter: Payer: Self-pay | Admitting: Family Medicine

## 2017-05-07 ENCOUNTER — Ambulatory Visit (INDEPENDENT_AMBULATORY_CARE_PROVIDER_SITE_OTHER): Payer: BLUE CROSS/BLUE SHIELD | Admitting: Family Medicine

## 2017-05-07 ENCOUNTER — Encounter: Payer: Self-pay | Admitting: Family Medicine

## 2017-05-07 VITALS — BP 136/72 | HR 84 | Temp 98.6°F | Resp 14 | Ht 68.0 in | Wt 241.0 lb

## 2017-05-07 DIAGNOSIS — I10 Essential (primary) hypertension: Secondary | ICD-10-CM | POA: Diagnosis not present

## 2017-05-07 DIAGNOSIS — Z6836 Body mass index (BMI) 36.0-36.9, adult: Secondary | ICD-10-CM | POA: Diagnosis not present

## 2017-05-07 MED ORDER — HYDROCHLOROTHIAZIDE 25 MG PO TABS: 25.0000 mg | ORAL_TABLET | Freq: Every day | ORAL | 3 refills | Status: DC

## 2017-05-07 NOTE — Assessment & Plan Note (Signed)
Well controlled, weight much improved, continue with low carb diet, veggies, water, protein Continue with exercise Goal weight around 190lbs

## 2017-05-07 NOTE — Progress Notes (Signed)
   Subjective:    Patient ID: Sara Hickman, female    DOB: 03-16-1971, 46 y.o.   MRN: [id number]  Patient presents for Follow-up (is not fasting)   Pt here for intermin follow up. Last visit BP was elevated, she had also gained 30lbs over the past year. She was restarted on HCTZ and blood pressure has improved  Fasting labs were done and were normal Her weight is down 17lbs since our visit 6 weeks ago   By changing diet, low carb, more veggies, fruit water, also walks daily 10,000.  Review Of Systems:  GEN- denies fatigue, fever, weight loss,weakness, recent illness HEENT- denies eye drainage, change in vision, nasal discharge, CVS- denies chest pain, palpitations RESP- denies SOB, cough, wheeze ABD- denies N/V, change in stools, abd pain GU- denies dysuria, hematuria, dribbling, incontinence MSK- denies joint pain, muscle aches, injury Neuro- denies headache, dizziness, syncope, seizure activity       Objective:    BP 136/72   Pulse 84   Temp 98.6 F (37 C) (Oral)   Resp 14   Ht 5\' 8"  (1.727 m)   Wt 241 lb (109.3 kg)   LMP 09/24/2012   SpO2 98%   BMI 36.64 kg/m  GEN- NAD, alert and oriented x3 HEENT- PERRL, EOMI, non injected sclera, pink conjunctiva, MMM, oropharynx clear CVS- RRR, no murmur RESP-CTAB EXT- No edema Pulses- Radial 2+        Assessment & Plan:      Problem List Items Addressed This Visit    None      Note: This dictation was prepared with Dragon dictation along with smaller phrase technology. Any transcriptional errors that result from this process are unintentional.

## 2017-05-07 NOTE — Patient Instructions (Signed)
F/U Oct for Physical

## 2017-05-21 ENCOUNTER — Encounter: Payer: Self-pay | Admitting: Family Medicine

## 2017-05-21 ENCOUNTER — Ambulatory Visit (INDEPENDENT_AMBULATORY_CARE_PROVIDER_SITE_OTHER): Payer: BLUE CROSS/BLUE SHIELD | Admitting: Family Medicine

## 2017-05-21 VITALS — BP 124/64 | HR 80 | Temp 98.3°F | Resp 18 | Ht 68.0 in | Wt 240.0 lb

## 2017-05-21 DIAGNOSIS — J069 Acute upper respiratory infection, unspecified: Secondary | ICD-10-CM

## 2017-05-21 DIAGNOSIS — J029 Acute pharyngitis, unspecified: Secondary | ICD-10-CM | POA: Diagnosis not present

## 2017-05-21 LAB — STREP GROUP A AG, W/REFLEX TO CULT: STREGTOCOCCUS GROUP A AG SCREEN: NOT DETECTED

## 2017-05-21 MED ORDER — HYDROCOD POLST-CPM POLST ER 10-8 MG/5ML PO SUER: 5.0000 mL | Freq: Two times a day (BID) | ORAL | 0 refills | Status: DC | PRN

## 2017-05-21 MED ORDER — FIRST-DUKES MOUTHWASH MT SUSP: OROMUCOSAL | 0 refills | Status: DC

## 2017-05-21 MED ORDER — AZITHROMYCIN 250 MG PO TABS: ORAL_TABLET | ORAL | 0 refills | Status: DC

## 2017-05-21 NOTE — Progress Notes (Signed)
   Subjective:    Patient ID: Sara Hickman, female    DOB: Oct 14, 1971, 46 y.o.   MRN: [id number]  Patient presents for Illness (x6 days- productive cough with yellow/ green mucus, sore throat, nasal congestion, HA , body aches) Sore throat started last week, then progressive , to cough with congestion, nasal congesiton, headache. acjes Takin dayquil/night quil, chlorospetic, cold east.  Recently came back from [place], coworker was sick  Unsure of fever, as she has been taking a lot of OTC meds     Review Of Systems:  GEN- denies fatigue, fever, weight loss,weakness, recent illness HEENT- denies eye drainage, change in vision, +nasal discharge, CVS- denies chest pain, palpitations RESP- denies SOB, +cough, wheeze ABD- denies N/V, change in stools, abd pain GU- denies dysuria, hematuria, dribbling, incontinence MSK- denies joint pain, muscle aches, injury Neuro- denies headache, dizziness, syncope, seizure activity       Objective:    BP 124/64   Pulse 80   Temp 98.3 F (36.8 C) (Oral)   Resp 18   Ht 5\' 8"  (1.727 m)   Wt 240 lb (108.9 kg)   LMP 09/24/2012   SpO2 98%   BMI 36.49 kg/m  GEN- NAD, alert and oriented x3 HEENT- PERRL, EOMI, non injected sclera, pink conjunctiva, MMM, oropharynx +injection,+ exudates , TM clear bilat no effusion,  + mild maxillary sinus tenderness, inflammed turbinates,  Nasal drainage  Neck- Supple, shotty  LAD CVS- RRR, no murmur RESP-CTAB EXT- No edema Pulses- Radial 2+  STREP NEG        Assessment & Plan:      Problem List Items Addressed This Visit    None    Visit Diagnoses    Pharyngitis, unspecified etiology    -  Primary   Relevant Orders   STREP GROUP A AG, W/REFLEX TO CULT (Completed)   Upper respiratory tract infection, unspecified type       progressive symptoms, Multiple OTC meds, with tylenol so unclear if fever. pharyngitis with exudates. Treat wtih zpak, magic mouthwash, tussionex   Relevant Medications   azithromycin (ZITHROMAX) 250 MG tablet   Diphenhyd-Hydrocort-Nystatin (FIRST-DUKES MOUTHWASH) SUSP      Note: This dictation was prepared with Dragon dictation along with smaller phrase technology. Any transcriptional errors that result from this process are unintentional.

## 2017-05-21 NOTE — Patient Instructions (Addendum)
F/U as needed Take antibiotics, take use magic mouthwash, cough medcine  Give note for work

## 2017-05-23 LAB — CULTURE, GROUP A STREP

## 2017-06-04 ENCOUNTER — Telehealth: Payer: Self-pay | Admitting: *Deleted

## 2017-06-04 MED ORDER — LEVOFLOXACIN 500 MG PO TABS: 500.0000 mg | ORAL_TABLET | Freq: Every day | ORAL | 0 refills | Status: DC

## 2017-06-04 MED ORDER — PREDNISONE 20 MG PO TABS: 20.0000 mg | ORAL_TABLET | Freq: Every day | ORAL | 0 refills | Status: DC

## 2017-06-04 NOTE — Telephone Encounter (Signed)
Call placed to patient and patient made aware via VM. 

## 2017-06-04 NOTE — Telephone Encounter (Signed)
Received call from patient.   States that she has completed ZPack from 05/21/2017, but she continue to have head and chest congestion, productive cough with yellow/ green colored sputum, and HA.   Requested MD to advise.

## 2017-06-04 NOTE — Telephone Encounter (Signed)
Give Levaquin 500mg  daily x 7 days Prednisone 20mg  daily x 5 days

## 2017-08-16 DIAGNOSIS — Z6835 Body mass index (BMI) 35.0-35.9, adult: Secondary | ICD-10-CM | POA: Diagnosis not present

## 2017-08-16 DIAGNOSIS — Z800 Family history of malignant neoplasm of digestive organs: Secondary | ICD-10-CM | POA: Diagnosis not present

## 2017-08-16 DIAGNOSIS — R229 Localized swelling, mass and lump, unspecified: Secondary | ICD-10-CM | POA: Diagnosis not present

## 2017-08-16 DIAGNOSIS — Z01419 Encounter for gynecological examination (general) (routine) without abnormal findings: Secondary | ICD-10-CM | POA: Diagnosis not present

## 2017-09-03 ENCOUNTER — Other Ambulatory Visit: Payer: Self-pay | Admitting: Obstetrics and Gynecology

## 2017-09-03 DIAGNOSIS — Z1231 Encounter for screening mammogram for malignant neoplasm of breast: Secondary | ICD-10-CM

## 2017-09-04 DIAGNOSIS — N3941 Urge incontinence: Secondary | ICD-10-CM | POA: Diagnosis not present

## 2017-09-04 DIAGNOSIS — N393 Stress incontinence (female) (male): Secondary | ICD-10-CM | POA: Diagnosis not present

## 2017-09-04 DIAGNOSIS — R3915 Urgency of urination: Secondary | ICD-10-CM | POA: Diagnosis not present

## 2017-09-04 DIAGNOSIS — R351 Nocturia: Secondary | ICD-10-CM | POA: Diagnosis not present

## 2017-09-05 ENCOUNTER — Ambulatory Visit
Admission: RE | Admit: 2017-09-05 | Discharge: 2017-09-05 | Disposition: A | Discharge status: Home / Self Care | Payer: BLUE CROSS/BLUE SHIELD | Source: Ambulatory Visit | Attending: Obstetrics and Gynecology | Admitting: Obstetrics and Gynecology

## 2017-09-05 DIAGNOSIS — Z1231 Encounter for screening mammogram for malignant neoplasm of breast: Secondary | ICD-10-CM | POA: Diagnosis not present

## 2017-09-11 DIAGNOSIS — N3941 Urge incontinence: Secondary | ICD-10-CM | POA: Diagnosis not present

## 2017-09-11 DIAGNOSIS — R351 Nocturia: Secondary | ICD-10-CM | POA: Diagnosis not present

## 2017-09-11 DIAGNOSIS — R3915 Urgency of urination: Secondary | ICD-10-CM | POA: Diagnosis not present

## 2017-09-11 DIAGNOSIS — N393 Stress incontinence (female) (male): Secondary | ICD-10-CM | POA: Diagnosis not present

## 2017-09-17 DIAGNOSIS — D171 Benign lipomatous neoplasm of skin and subcutaneous tissue of trunk: Secondary | ICD-10-CM | POA: Diagnosis not present

## 2017-09-18 DIAGNOSIS — R350 Frequency of micturition: Secondary | ICD-10-CM | POA: Diagnosis not present

## 2017-09-18 DIAGNOSIS — N3941 Urge incontinence: Secondary | ICD-10-CM | POA: Diagnosis not present

## 2017-09-18 DIAGNOSIS — N393 Stress incontinence (female) (male): Secondary | ICD-10-CM | POA: Diagnosis not present

## 2017-09-18 DIAGNOSIS — R351 Nocturia: Secondary | ICD-10-CM | POA: Diagnosis not present

## 2017-10-02 DIAGNOSIS — N393 Stress incontinence (female) (male): Secondary | ICD-10-CM | POA: Diagnosis not present

## 2017-10-02 DIAGNOSIS — R351 Nocturia: Secondary | ICD-10-CM | POA: Diagnosis not present

## 2017-10-02 DIAGNOSIS — N3941 Urge incontinence: Secondary | ICD-10-CM | POA: Diagnosis not present

## 2017-10-02 DIAGNOSIS — R3915 Urgency of urination: Secondary | ICD-10-CM | POA: Diagnosis not present

## 2017-10-11 ENCOUNTER — Encounter: Payer: BLUE CROSS/BLUE SHIELD | Admitting: Family Medicine

## 2017-10-24 ENCOUNTER — Other Ambulatory Visit: Payer: Self-pay | Admitting: General Surgery

## 2017-10-24 DIAGNOSIS — D171 Benign lipomatous neoplasm of skin and subcutaneous tissue of trunk: Secondary | ICD-10-CM | POA: Diagnosis not present

## 2018-05-16 ENCOUNTER — Encounter: Payer: Self-pay | Admitting: Family Medicine

## 2018-05-16 ENCOUNTER — Other Ambulatory Visit: Payer: Self-pay

## 2018-05-16 ENCOUNTER — Ambulatory Visit (INDEPENDENT_AMBULATORY_CARE_PROVIDER_SITE_OTHER): Payer: BLUE CROSS/BLUE SHIELD | Admitting: Family Medicine

## 2018-05-16 VITALS — BP 128/84 | HR 86 | Temp 98.7°F | Resp 14 | Ht 68.0 in | Wt 253.0 lb

## 2018-05-16 DIAGNOSIS — J0100 Acute maxillary sinusitis, unspecified: Secondary | ICD-10-CM

## 2018-05-16 MED ORDER — AMOXICILLIN-POT CLAVULANATE 875-125 MG PO TABS: 1.0000 | ORAL_TABLET | Freq: Two times a day (BID) | ORAL | 0 refills | Status: DC

## 2018-05-16 MED ORDER — METHYLPREDNISOLONE ACETATE 40 MG/ML IJ SUSP
40.0000 mg | Freq: Once | INTRAMUSCULAR | Status: AC
  Administered 2018-05-16: 40 mg via INTRAMUSCULAR

## 2018-05-16 NOTE — Patient Instructions (Addendum)
Schedule a physical For antibiotics  Steroid shot given  Use flonase and claritin Saxenda - Weight loss medication

## 2018-05-16 NOTE — Progress Notes (Signed)
   Subjective:    Patient ID: Sara Hickman, female    DOB: 1971-03-09, 47 y.o.   MRN: [id number]  Patient presents for Illness (x3 weeks- productive cough with green mucus, nasal congestion (thick and green), sinus pressure, chest congestion, sore throat)  Pt here with cough with production, head congestion, was getting better but now back in sinuses with pressure, sore throat, subjective fever early on.  Using OTC cold and sinus/flu medicine for past weeks  Using flonase for allergy   No known sick contacts  No vomiting   Review Of Systems:  GEN- denies fatigue, fever, weight loss,weakness, recent illness HEENT- denies eye drainage, change in vision,+ nasal discharge, CVS- denies chest pain, palpitations RESP- denies SOB, +cough, wheeze ABD- denies N/V, change in stools, abd pain Neuro- + headache, dizziness, syncope, seizure activity       Objective:    BP 128/84   Pulse 86   Temp 98.7 F (37.1 C) (Oral)   Resp 14   Ht 5\' 8"  (1.727 m)   Wt 253 lb (114.8 kg)   LMP 09/24/2012   SpO2 97%   BMI 38.47 kg/m  GEN- NAD, alert and oriented x3 HEENT- PERRL, EOMI, non injected sclera, pink conjunctiva, MMM, oropharynx mild injection, TM clear bilat no effusion,  + maxillary sinus tenderness, inflammed turbinates,  Nasal drainage  Neck- Supple, no LAD CVS- RRR, no murmur RESP-CTAB EXT- No edema Pulses- Radial 2+          Assessment & Plan:   We briefly discussed weight loss meds, she needs to return for CPE/Labs before making decision   Problem List Items Addressed This Visit    None    Visit Diagnoses    Acute maxillary sinusitis, recurrence not specified    -  Primary   Depo Medrol shot given, antibiotics, continue nasal sterid, add oral anti-histamine   Relevant Medications   amoxicillin-clavulanate (AUGMENTIN) 875-125 MG tablet   methylPREDNISolone acetate (DEPO-MEDROL) injection 40 mg (Completed)      Note: This dictation was prepared with Dragon dictation  along with smaller phrase technology. Any transcriptional errors that result from this process are unintentional.

## 2018-05-19 ENCOUNTER — Encounter: Payer: Self-pay | Admitting: Family Medicine

## 2018-06-19 ENCOUNTER — Ambulatory Visit (INDEPENDENT_AMBULATORY_CARE_PROVIDER_SITE_OTHER): Payer: BLUE CROSS/BLUE SHIELD | Admitting: Family Medicine

## 2018-06-19 ENCOUNTER — Other Ambulatory Visit: Payer: Self-pay

## 2018-06-19 ENCOUNTER — Encounter: Payer: Self-pay | Admitting: Family Medicine

## 2018-06-19 VITALS — BP 128/82 | HR 80 | Temp 98.3°F | Resp 12 | Ht 68.0 in | Wt 254.0 lb

## 2018-06-19 DIAGNOSIS — I10 Essential (primary) hypertension: Secondary | ICD-10-CM

## 2018-06-19 DIAGNOSIS — J301 Allergic rhinitis due to pollen: Secondary | ICD-10-CM | POA: Diagnosis not present

## 2018-06-19 DIAGNOSIS — Z0000 Encounter for general adult medical examination without abnormal findings: Secondary | ICD-10-CM | POA: Diagnosis not present

## 2018-06-19 DIAGNOSIS — Z6838 Body mass index (BMI) 38.0-38.9, adult: Secondary | ICD-10-CM | POA: Diagnosis not present

## 2018-06-19 DIAGNOSIS — E559 Vitamin D deficiency, unspecified: Secondary | ICD-10-CM | POA: Diagnosis not present

## 2018-06-19 LAB — URINALYSIS, ROUTINE W REFLEX MICROSCOPIC
BILIRUBIN URINE: NEGATIVE
Bacteria, UA: NONE SEEN /HPF
Glucose, UA: NEGATIVE
KETONES UR: NEGATIVE
Leukocytes, UA: NEGATIVE
Nitrite: NEGATIVE
PROTEIN: NEGATIVE
SPECIFIC GRAVITY, URINE: 1.015 (ref 1.001–1.03)
WBC UA: NONE SEEN /HPF (ref 0–5)
pH: 7 (ref 5.0–8.0)

## 2018-06-19 LAB — MICROSCOPIC MESSAGE

## 2018-06-19 MED ORDER — FLUTICASONE PROPIONATE 50 MCG/ACT NA SUSP: 2.0000 | Freq: Every day | NASAL | 2 refills | Status: DC | PRN

## 2018-06-19 MED ORDER — PHENTERMINE HCL 37.5 MG PO TABS: 37.5000 mg | ORAL_TABLET | Freq: Every day | ORAL | 2 refills | Status: DC

## 2018-06-19 MED ORDER — HYDROCHLOROTHIAZIDE 25 MG PO TABS: 25.0000 mg | ORAL_TABLET | Freq: Every day | ORAL | 3 refills | Status: DC

## 2018-06-19 MED ORDER — FLUTICASONE PROPIONATE 50 MCG/ACT NA SUSP: 2.0000 | Freq: Every day | NASAL | 2 refills | Status: DC

## 2018-06-19 MED ORDER — CETIRIZINE HCL 10 MG PO TABS: 10.0000 mg | ORAL_TABLET | Freq: Every day | ORAL | 11 refills | Status: DC

## 2018-06-19 NOTE — Progress Notes (Signed)
Patient: Sara Hickman, Female    DOB: 25-Apr-1971, 47 y.o.   MRN: [id number] Visit Date: 06/23/2018  Today's Provider: Delsa Grana, PA-C   Chief Complaint  Patient presents with  . CPE    is fasting   Subjective:    Annual physical exam Sara Hickman is a 47 y.o. female who presents today for health maintenance and complete physical. She feels well. She reports exercising 5 d a week. She reports she is sleeping well.  -----------------------------------------------------------------  She believes she has gained weight and would like to start phentermine again.  She is walking 15-30 minutes 5 days a week, her goal is 10,000 steps.  She is eating a healthy diet of vegetables, meat, limiting sweats, but still eats potatoes and pasta.  She is sleeping well, limiting her caffeine.  Her BP is well controlled on HCTZ.  No SE.    She has a sinus HA, requests refills on allergy medications, flonase and zyrtec.     Review of Systems  Constitutional: Negative.   HENT: Positive for sinus pressure.   Eyes: Negative.   Respiratory: Negative.   Cardiovascular: Negative.   Gastrointestinal: Negative.   Endocrine: Negative.   Genitourinary: Negative.   Musculoskeletal: Negative.   Skin: Negative.   Allergic/Immunologic: Positive for environmental allergies.  Neurological: Negative.   Hematological: Negative.   Psychiatric/Behavioral: Negative.   All other systems reviewed and are negative.   Social History      She  reports that she has never smoked. She has never used smokeless tobacco. She reports that she drinks alcohol. She reports that she does not use drugs.       Social History   Socioeconomic History  . Marital status: Married    Spouse name: Not on file  . Number of children: Not on file  . Years of education: Not on file  . Highest education level: Not on file  Occupational History  . Not on file  Social Needs  . Financial resource strain: Not on file  . Food  insecurity:    Worry: Not on file    Inability: Not on file  . Transportation needs:    Medical: Not on file    Non-medical: Not on file  Tobacco Use  . Smoking status: Never Smoker  . Smokeless tobacco: Never Used  Substance and Sexual Activity  . Alcohol use: Yes    Comment: occasional - wine once every 3 months  . Drug use: No  . Sexual activity: Yes    Birth control/protection: Surgical    Comment: s/p hysterectomy  Lifestyle  . Physical activity:    Days per week: 5 days    Minutes per session: 20 min  . Stress: Not on file  Relationships  . Social connections:    Talks on phone: Not on file    Gets together: Not on file    Attends religious service: Not on file    Active member of club or organization: Not on file    Attends meetings of clubs or organizations: Not on file    Relationship status: Not on file  Other Topics Concern  . Not on file  Social History Narrative  . Not on file    Past Medical History:  Diagnosis Date  . Fibroids   . GERD (gastroesophageal reflux disease)   . SVD (spontaneous vaginal delivery)    x 1     Patient Active Problem List   Diagnosis Date Noted  . Allergic  rhinitis 11/09/2015  . Essential hypertension, benign 02/07/2014  . GERD (gastroesophageal reflux disease) 02/07/2014  . Fibroids 12/03/2013  . Obesity 11/14/2013  . Fibroid uterus 11/14/2013    Past Surgical History:  Procedure Laterality Date  . ABDOMINAL HYSTERECTOMY N/A 12/03/2013   Procedure: HYSTERECTOMY ABDOMINAL;  Surgeon: Alwyn Pea, MD;  Location: [facility];  Service: Gynecology;  Laterality: N/A;  . BILATERAL SALPINGECTOMY Bilateral 12/03/2013   Procedure: BILATERAL SALPINGECTOMY;  Surgeon: Alwyn Pea, MD;  Location: [facility];  Service: Gynecology;  Laterality: Bilateral;    Family History        Family Status  Relation Name Status  . PGM  Deceased  . MGM  Deceased  . Mother  Alive  . Ethlyn Daniels  Deceased  . Father  Deceased  . PGF  Deceased    . MGF  Alive  . Sister  Alive  . Annamarie Major  (Not Specified)  . Mat Uncle  (Not Specified)        Her family history includes Breast cancer (age of onset: 33) in her paternal aunt; Cancer in her paternal aunt; Colon cancer (age of onset: 36) in her paternal aunt; Diabetes in her paternal grandmother and paternal uncle; Hypertension in her mother, paternal aunt, paternal grandmother, and paternal uncle; Liver disease in her father; Ovarian cancer (age of onset: 63) in her maternal grandmother; Stroke in her maternal uncle.      No Known Allergies   Current Outpatient Medications:  .  Cyanocobalamin (VITAMIN B12 PO), Take 1,000 mcg by mouth daily. , Disp: , Rfl:  .  fish oil-omega-3 fatty acids 1000 MG capsule, Take 1 g by mouth daily. , Disp: , Rfl:  .  fluticasone (FLONASE) 50 MCG/ACT nasal spray, Place 2 sprays into both nostrils daily as needed., Disp: 16 g, Rfl: 2 .  hydrochlorothiazide (HYDRODIURIL) 25 MG tablet, Take 1 tablet (25 mg total) by mouth daily., Disp: 90 tablet, Rfl: 3 .  Multiple Vitamin (MULITIVITAMIN WITH MINERALS) TABS, Take 1 tablet by mouth daily., Disp: , Rfl:  .  VITAMIN D, CHOLECALCIFEROL, PO, Take 1,000 Units by mouth daily. , Disp: , Rfl:  .  cetirizine (ZYRTEC) 10 MG tablet, Take 1 tablet (10 mg total) by mouth daily., Disp: 30 tablet, Rfl: 11 .  fluticasone (FLONASE) 50 MCG/ACT nasal spray, Place 2 sprays into both nostrils daily., Disp: 16 g, Rfl: 2 .  phentermine (ADIPEX-P) 37.5 MG tablet, Take 1 tablet (37.5 mg total) by mouth daily before breakfast., Disp: 30 tablet, Rfl: 2   Patient Care Team: Alycia Rossetti, MD as PCP - General (Family Medicine)      Objective:   Vitals: BP 128/82   Pulse 80   Temp 98.3 F (36.8 C) (Oral)   Resp 12   Ht 5\' 8"  (1.727 m)   Wt 254 lb (115.2 kg)   LMP 09/24/2012   SpO2 97%   BMI 38.62 kg/m    Vitals:   06/19/18 0803  BP: 128/82  Pulse: 80  Resp: 12  Temp: 98.3 F (36.8 C)  TempSrc: Oral  SpO2: 97%   Weight: 254 lb (115.2 kg)  Height: 5\' 8"  (1.727 m)     Physical Exam  Constitutional: She is oriented to person, place, and time. She appears well-developed and well-nourished.  Non-toxic appearance. No distress.  HENT:  Head: Normocephalic and atraumatic.  Right Ear: External ear normal.  Left Ear: External ear normal.  Nose: Nose normal.  Mouth/Throat: Uvula is midline,  oropharynx is clear and moist and mucous membranes are normal.  Eyes: Pupils are equal, round, and reactive to light. Conjunctivae, EOM and lids are normal. No scleral icterus.  Neck: Normal range of motion and phonation normal. Neck supple. No tracheal deviation present. No thyromegaly present.  Cardiovascular: Normal rate, regular rhythm, normal heart sounds, intact distal pulses and normal pulses. Exam reveals no gallop and no friction rub.  No murmur heard. Pulses:      Radial pulses are 2+ on the right side, and 2+ on the left side.       Posterior tibial pulses are 2+ on the right side, and 2+ on the left side.  Pulmonary/Chest: Effort normal and breath sounds normal. No stridor. No respiratory distress. She has no wheezes. She has no rhonchi. She has no rales. She exhibits no tenderness.  Abdominal: Soft. Normal appearance and bowel sounds are normal. She exhibits no distension and no mass. There is no tenderness. There is no rebound and no guarding.  Musculoskeletal: Normal range of motion. She exhibits no edema or deformity.  Lymphadenopathy:    She has no cervical adenopathy.  Neurological: She is alert and oriented to person, place, and time. She exhibits normal muscle tone. Coordination and gait normal.  Skin: Skin is warm, dry and intact. Capillary refill takes less than 2 seconds. No rash noted. She is not diaphoretic. No pallor.  Psychiatric: She has a normal mood and affect. Her speech is normal and behavior is normal.  Nursing note and vitals reviewed.    Depression Screen PHQ 2/9 Scores 06/19/2018  05/07/2017 03/25/2017 04/30/2014  PHQ - 2 Score 0 0 0 0  PHQ- 9 Score 0 0 0 -      Assessment & Plan:     Routine Health Maintenance and Physical Exam  Exercise Activities and Dietary recommendations Goals    . Exercise 150 min/wk Moderate Activity     Increasing walking time and # of steps, starting with reaching 10,000 step/day goal and increasing       Immunization History  Administered Date(s) Administered  . Influenza,inj,Quad PF,6+ Mos 11/11/2013, 11/09/2015  . Tdap 11/09/2015    Health Maintenance  Topic Date Due  . HIV Screening  01/03/1986  . PAP SMEAR  01/04/1992  . INFLUENZA VACCINE  07/24/2018  . TETANUS/TDAP  11/08/2025     Discussed health benefits of physical activity, and encouraged her to engage in regular exercise appropriate for her age and condition.    --------------------------------------------------------------------   ICD-10-CM   1. General medical exam Z00.00 CBC with Differential/Platelet    COMPLETE METABOLIC PANEL WITH GFR    Lipid panel    Urinalysis, Routine w reflex microscopic    VITAMIN D 25 Hydroxy (Vit-D Deficiency, Fractures)    TSH    Microscopic Message  2. Essential hypertension, benign I10 CBC with Differential/Platelet    COMPLETE METABOLIC PANEL WITH GFR    Urinalysis, Routine w reflex microscopic    hydrochlorothiazide (HYDRODIURIL) 25 MG tablet  3. Class 2 severe obesity due to excess calories with serious comorbidity and body mass index (BMI) of 38.0 to 38.9 in adult (HCC) E66.01 CBC with Differential/Platelet   H60.73 COMPLETE METABOLIC PANEL WITH GFR    Lipid panel    TSH    phentermine (ADIPEX-P) 37.5 MG tablet  4. Vitamin D deficiency E55.9 VITAMIN D 25 Hydroxy (Vit-D Deficiency, Fractures)  5. Seasonal allergic rhinitis due to pollen J30.1 fluticasone (FLONASE) 50 MCG/ACT nasal spray    cetirizine (  ZYRTEC) 10 MG tablet     Delsa Grana, PA-C 06/23/18 1:59 AM  [facility]

## 2018-06-19 NOTE — Patient Instructions (Signed)
Health Maintenance, Female Adopting a healthy lifestyle and getting preventive care can go a long way to promote health and wellness. Talk with your health care provider about what schedule of regular examinations is right for you. This is a good chance for you to check in with your provider about disease prevention and staying healthy. In between checkups, there are plenty of things you can do on your own. Experts have done a lot of research about which lifestyle changes and preventive measures are most likely to keep you healthy. Ask your health care provider for more information. Weight and diet Eat a healthy diet  Be sure to include plenty of vegetables, fruits, low-fat dairy products, and lean protein.  Do not eat a lot of foods high in solid fats, added sugars, or salt.  Get regular exercise. This is one of the most important things you can do for your health. ? Most adults should exercise for at least 150 minutes each week. The exercise should increase your heart rate and make you sweat (moderate-intensity exercise). ? Most adults should also do strengthening exercises at least twice a week. This is in addition to the moderate-intensity exercise.  Maintain a healthy weight  Body mass index (BMI) is a measurement that can be used to identify possible weight problems. It estimates body fat based on height and weight. Your health care provider can help determine your BMI and help you achieve or maintain a healthy weight.  For females 20 years of age and older: ? A BMI below 18.5 is considered underweight. ? A BMI of 18.5 to 24.9 is normal. ? A BMI of 25 to 29.9 is considered overweight. ? A BMI of 30 and above is considered obese.  Watch levels of cholesterol and blood lipids  You should start having your blood tested for lipids and cholesterol at 47 years of age, then have this test every 5 years.  You may need to have your cholesterol levels checked more often if: ? Your lipid or  cholesterol levels are high. ? You are older than 47 years of age. ? You are at high risk for heart disease.  Cancer screening Lung Cancer  Lung cancer screening is recommended for adults 55-80 years old who are at high risk for lung cancer because of a history of smoking.  A yearly low-dose CT scan of the lungs is recommended for people who: ? Currently smoke. ? Have quit within the past 15 years. ? Have at least a 30-pack-year history of smoking. A pack year is smoking an average of one pack of cigarettes a day for 1 year.  Yearly screening should continue until it has been 15 years since you quit.  Yearly screening should stop if you develop a health problem that would prevent you from having lung cancer treatment.  Breast Cancer  Practice breast self-awareness. This means understanding how your breasts normally appear and feel.  It also means doing regular breast self-exams. Let your health care provider know about any changes, no matter how small.  If you are in your 20s or 30s, you should have a clinical breast exam (CBE) by a health care provider every 1-3 years as part of a regular health exam.  If you are 40 or older, have a CBE every year. Also consider having a breast X-ray (mammogram) every year.  If you have a family history of breast cancer, talk to your health care provider about genetic screening.  If you are at high risk   for breast cancer, talk to your health care provider about having an MRI and a mammogram every year.  Breast cancer gene (BRCA) assessment is recommended for women who have family members with BRCA-related cancers. BRCA-related cancers include: ? Breast. ? Ovarian. ? Tubal. ? Peritoneal cancers.  Results of the assessment will determine the need for genetic counseling and BRCA1 and BRCA2 testing.  Cervical Cancer Your health care provider may recommend that you be screened regularly for cancer of the pelvic organs (ovaries, uterus, and  vagina). This screening involves a pelvic examination, including checking for microscopic changes to the surface of your cervix (Pap test). You may be encouraged to have this screening done every 3 years, beginning at age 22.  For women ages 56-65, health care providers may recommend pelvic exams and Pap testing every 3 years, or they may recommend the Pap and pelvic exam, combined with testing for human papilloma virus (HPV), every 5 years. Some types of HPV increase your risk of cervical cancer. Testing for HPV may also be done on women of any age with unclear Pap test results.  Other health care providers may not recommend any screening for nonpregnant women who are considered low risk for pelvic cancer and who do not have symptoms. Ask your health care provider if a screening pelvic exam is right for you.  If you have had past treatment for cervical cancer or a condition that could lead to cancer, you need Pap tests and screening for cancer for at least 20 years after your treatment. If Pap tests have been discontinued, your risk factors (such as having a new sexual partner) need to be reassessed to determine if screening should resume. Some women have medical problems that increase the chance of getting cervical cancer. In these cases, your health care provider may recommend more frequent screening and Pap tests.  Colorectal Cancer  This type of cancer can be detected and often prevented.  Routine colorectal cancer screening usually begins at 47 years of age and continues through 47 years of age.  Your health care provider may recommend screening at an earlier age if you have risk factors for colon cancer.  Your health care provider may also recommend using home test kits to check for hidden blood in the stool.  A small camera at the end of a tube can be used to examine your colon directly (sigmoidoscopy or colonoscopy). This is done to check for the earliest forms of colorectal  cancer.  Routine screening usually begins at age 33.  Direct examination of the colon should be repeated every 5-10 years through 47 years of age. However, you may need to be screened more often if early forms of precancerous polyps or small growths are found.  Skin Cancer  Check your skin from head to toe regularly.  Tell your health care provider about any new moles or changes in moles, especially if there is a change in a mole's shape or color.  Also tell your health care provider if you have a mole that is larger than the size of a pencil eraser.  Always use sunscreen. Apply sunscreen liberally and repeatedly throughout the day.  Protect yourself by wearing long sleeves, pants, a wide-brimmed hat, and sunglasses whenever you are outside.  Heart disease, diabetes, and high blood pressure  High blood pressure causes heart disease and increases the risk of stroke. High blood pressure is more likely to develop in: ? People who have blood pressure in the high end of  the normal range (130-139/85-89 mm Hg). ? People who are overweight or obese. ? People who are African American.  If you are 21-29 years of age, have your blood pressure checked every 3-5 years. If you are 3 years of age or older, have your blood pressure checked every year. You should have your blood pressure measured twice-once when you are at a hospital or clinic, and once when you are not at a hospital or clinic. Record the average of the two measurements. To check your blood pressure when you are not at a hospital or clinic, you can use: ? An automated blood pressure machine at a pharmacy. ? A home blood pressure monitor.  If you are between 17 years and 37 years old, ask your health care provider if you should take aspirin to prevent strokes.  Have regular diabetes screenings. This involves taking a blood sample to check your fasting blood sugar level. ? If you are at a normal weight and have a low risk for diabetes,  have this test once every three years after 47 years of age. ? If you are overweight and have a high risk for diabetes, consider being tested at a younger age or more often. Preventing infection Hepatitis B  If you have a higher risk for hepatitis B, you should be screened for this virus. You are considered at high risk for hepatitis B if: ? You were born in a country where hepatitis B is common. Ask your health care provider which countries are considered high risk. ? Your parents were born in a high-risk country, and you have not been immunized against hepatitis B (hepatitis B vaccine). ? You have HIV or AIDS. ? You use needles to inject street drugs. ? You live with someone who has hepatitis B. ? You have had sex with someone who has hepatitis B. ? You get hemodialysis treatment. ? You take certain medicines for conditions, including cancer, organ transplantation, and autoimmune conditions.  Hepatitis C  Blood testing is recommended for: ? Everyone born from [birth year] through 1965. ? Anyone with known risk factors for hepatitis C.  Sexually transmitted infections (STIs)  You should be screened for sexually transmitted infections (STIs) including gonorrhea and chlamydia if: ? You are sexually active and are younger than 47 years of age. ? You are older than 47 years of age and your health care provider tells you that you are at risk for this type of infection. ? Your sexual activity has changed since you were last screened and you are at an increased risk for chlamydia or gonorrhea. Ask your health care provider if you are at risk.  If you do not have HIV, but are at risk, it may be recommended that you take a prescription medicine daily to prevent HIV infection. This is called pre-exposure prophylaxis (PrEP). You are considered at risk if: ? You are sexually active and do not regularly use condoms or know the HIV status of your partner(s). ? You take drugs by injection. ? You are  sexually active with a partner who has HIV.  Talk with your health care provider about whether you are at high risk of being infected with HIV. If you choose to begin PrEP, you should first be tested for HIV. You should then be tested every 3 months for as long as you are taking PrEP. Pregnancy  If you are premenopausal and you may become pregnant, ask your health care provider about preconception counseling.  If you may become  pregnant, take 400 to 800 micrograms (mcg) of folic acid every day.  If you want to prevent pregnancy, talk to your health care provider about birth control (contraception). Osteoporosis and menopause  Osteoporosis is a disease in which the bones lose minerals and strength with aging. This can result in serious bone fractures. Your risk for osteoporosis can be identified using a bone density scan.  If you are 74 years of age or older, or if you are at risk for osteoporosis and fractures, ask your health care provider if you should be screened.  Ask your health care provider whether you should take a calcium or vitamin D supplement to lower your risk for osteoporosis.  Menopause may have certain physical symptoms and risks.  Hormone replacement therapy may reduce some of these symptoms and risks. Talk to your health care provider about whether hormone replacement therapy is right for you. Follow these instructions at home:  Schedule regular health, dental, and eye exams.  Stay current with your immunizations.  Do not use any tobacco products including cigarettes, chewing tobacco, or electronic cigarettes.  If you are pregnant, do not drink alcohol.  If you are breastfeeding, limit how much and how often you drink alcohol.  Limit alcohol intake to no more than 1 drink per day for nonpregnant women. One drink equals 12 ounces of beer, 5 ounces of wine, or 1 ounces of hard liquor.  Do not use street drugs.  Do not share needles.  Ask your health care  provider for help if you need support or information about quitting drugs.  Tell your health care provider if you often feel depressed.  Tell your health care provider if you have ever been abused or do not feel safe at home. This information is not intended to replace advice given to you by your health care provider. Make sure you discuss any questions you have with your health care provider. Document Released: 06/25/2011 Document Revised: 05/17/2016 Document Reviewed: 09/13/2015 Elsevier Interactive Patient Education  2018 [employer] Calories are units of energy. Your body needs a certain amount of calories from food to keep you going throughout the day. When you eat more calories than your body needs, your body stores the extra calories as fat. When you eat fewer calories than your body needs, your body burns fat to get the energy it needs. Calorie counting means keeping track of how many calories you eat and drink each day. Calorie counting can be helpful if you need to lose weight. If you make sure to eat fewer calories than your body needs, you should lose weight. Ask your health care provider what a healthy weight is for you. For calorie counting to work, you will need to eat the right number of calories in a day in order to lose a healthy amount of weight per week. A dietitian can help you determine how many calories you need in a day and will give you suggestions on how to reach your calorie goal.  A healthy amount of weight to lose per week is usually 1-2 lb (0.5-0.9 kg). This usually means that your daily calorie intake should be reduced by 500-750 calories.  Eating 1,200 - 1,500 calories per day can help most women lose weight.  Eating 1,500 - 1,800 calories per day can help most men lose weight.  What is my plan? My goal is to have __________ calories per day. If I have this many calories per  day, I should lose around __________ pounds per  week. What do I need to know about calorie counting? In order to meet your daily calorie goal, you will need to:  Find out how many calories are in each food you would like to eat. Try to do this before you eat.  Decide how much of the food you plan to eat.  Write down what you ate and how many calories it had. Doing this is called keeping a food log.  To successfully lose weight, it is important to balance calorie counting with a healthy lifestyle that includes regular activity. Aim for 150 minutes of moderate exercise (such as walking) or 75 minutes of vigorous exercise (such as running) each week. Where do I find calorie information?  The number of calories in a food can be found on a Nutrition Facts label. If a food does not have a Nutrition Facts label, try to look up the calories online or ask your dietitian for help. Remember that calories are listed per serving. If you choose to have more than one serving of a food, you will have to multiply the calories per serving by the amount of servings you plan to eat. For example, the label on a package of bread might say that a serving size is 1 slice and that there are 90 calories in a serving. If you eat 1 slice, you will have eaten 90 calories. If you eat 2 slices, you will have eaten 180 calories. How do I keep a food log? Immediately after each meal, record the following information in your food log:  What you ate. Don't forget to include toppings, sauces, and other extras on the food.  How much you ate. This can be measured in cups, ounces, or number of items.  How many calories each food and drink had.  The total number of calories in the meal.  Keep your food log near you, such as in a small notebook in your pocket, or use a mobile app or website. Some programs will calculate calories for you and show you how many calories you have left for the day to meet your goal. What are some calorie counting tips?  Use your calories on foods  and drinks that will fill you up and not leave you hungry: ? Some examples of foods that fill you up are nuts and nut butters, vegetables, lean proteins, and high-fiber foods like whole grains. High-fiber foods are foods with more than 5 g fiber per serving. ? Drinks such as sodas, specialty coffee drinks, alcohol, and juices have a lot of calories, yet do not fill you up.  Eat nutritious foods and avoid empty calories. Empty calories are calories you get from foods or beverages that do not have many vitamins or protein, such as candy, sweets, and soda. It is better to have a nutritious high-calorie food (such as an avocado) than a food with few nutrients (such as a bag of chips).  Know how many calories are in the foods you eat most often. This will help you calculate calorie counts faster.  Pay attention to calories in drinks. Low-calorie drinks include water and unsweetened drinks.  Pay attention to nutrition labels for "low fat" or "fat free" foods. These foods sometimes have the same amount of calories or more calories than the full fat versions. They also often have added sugar, starch, or salt, to make up for flavor that was removed with the fat.  Find a way of  tracking calories that works for you. Get creative. Try different apps or programs if writing down calories does not work for you. What are some portion control tips?  Know how many calories are in a serving. This will help you know how many servings of a certain food you can have.  Use a measuring cup to measure serving sizes. You could also try weighing out portions on a kitchen scale. With time, you will be able to estimate serving sizes for some foods.  Take some time to put servings of different foods on your favorite plates, bowls, and cups so you know what a serving looks like.  Try not to eat straight from a bag or box. Doing this can lead to overeating. Put the amount you would like to eat in a cup or on a plate to make  sure you are eating the right portion.  Use smaller plates, glasses, and bowls to prevent overeating.  Try not to multitask (for example, watch TV or use your computer) while eating. If it is time to eat, sit down at a table and enjoy your food. This will help you to know when you are full. It will also help you to be aware of what you are eating and how much you are eating. What are tips for following this plan? Reading food labels  Check the calorie count compared to the serving size. The serving size may be smaller than what you are used to eating.  Check the source of the calories. Make sure the food you are eating is high in vitamins and protein and low in saturated and trans fats. Shopping  Read nutrition labels while you shop. This will help you make healthy decisions before you decide to purchase your food.  Make a grocery list and stick to it. Cooking  Try to cook your favorite foods in a healthier way. For example, try baking instead of frying.  Use low-fat dairy products. Meal planning  Use more fruits and vegetables. Half of your plate should be fruits and vegetables.  Include lean proteins like poultry and fish. How do I count calories when eating out?  Ask for smaller portion sizes.  Consider sharing an entree and sides instead of getting your own entree.  If you get your own entree, eat only half. Ask for a box at the beginning of your meal and put the rest of your entree in it so you are not tempted to eat it.  If calories are listed on the menu, choose the lower calorie options.  Choose dishes that include vegetables, fruits, whole grains, low-fat dairy products, and lean protein.  Choose items that are boiled, broiled, grilled, or steamed. Stay away from items that are buttered, battered, fried, or served with cream sauce. Items labeled "crispy" are usually fried, unless stated otherwise.  Choose water, low-fat milk, unsweetened iced tea, or other drinks  without added sugar. If you want an alcoholic beverage, choose a lower calorie option such as a glass of wine or light beer.  Ask for dressings, sauces, and syrups on the side. These are usually high in calories, so you should limit the amount you eat.  If you want a salad, choose a garden salad and ask for grilled meats. Avoid extra toppings like bacon, cheese, or fried items. Ask for the dressing on the side, or ask for olive oil and vinegar or lemon to use as dressing.  Estimate how many servings of a food you are given.  For example, a serving of cooked rice is  cup or about the size of half a baseball. Knowing serving sizes will help you be aware of how much food you are eating at restaurants. The list below tells you how big or small some common portion sizes are based on everyday objects: ? 1 oz-4 stacked dice. ? 3 oz-1 deck of cards. ? 1 tsp-1 die. ? 1 Tbsp- a ping-pong ball. ? 2 Tbsp-1 ping-pong ball. ?  cup- baseball. ? 1 cup-1 baseball. Summary  Calorie counting means keeping track of how many calories you eat and drink each day. If you eat fewer calories than your body needs, you should lose weight.  A healthy amount of weight to lose per week is usually 1-2 lb (0.5-0.9 kg). This usually means reducing your daily calorie intake by 500-750 calories.  The number of calories in a food can be found on a Nutrition Facts label. If a food does not have a Nutrition Facts label, try to look up the calories online or ask your dietitian for help.  Use your calories on foods and drinks that will fill you up, and not on foods and drinks that will leave you hungry.  Use smaller plates, glasses, and bowls to prevent overeating. This information is not intended to replace advice given to you by your health care provider. Make sure you discuss any questions you have with your health care provider. Document Released: 12/10/2005 Document Revised: 11/09/2016 Document Reviewed:  11/09/2016 Elsevier Interactive Patient Education  2018 [employer].  Exercising to [employer] Exercising can help you to lose weight. In order to lose weight through exercise, you need to do vigorous-intensity exercise. You can tell that you are exercising with vigorous intensity if you are breathing very hard and fast and cannot hold a conversation while exercising. Moderate-intensity exercise helps to maintain your current weight. You can tell that you are exercising at a moderate level if you have a higher heart rate and faster breathing, but you are still able to hold a conversation. How often should I exercise? Choose an activity that you enjoy and set realistic goals. Your health care provider can help you to make an activity plan that works for you. Exercise regularly as directed by your health care provider. This may include:  Doing resistance training twice each week, such as: ? Push-ups. ? Sit-ups. ? Lifting weights. ? Using resistance bands.  Doing a given intensity of exercise for a given amount of time. Choose from these options: ? 150 minutes of moderate-intensity exercise every week. ? 75 minutes of vigorous-intensity exercise every week. ? A mix of moderate-intensity and vigorous-intensity exercise every week.  Children, pregnant women, people who are out of shape, people who are overweight, and older adults may need to consult a health care provider for individual recommendations. If you have any sort of medical condition, be sure to consult your health care provider before starting a new exercise program. What are some activities that can help me to lose weight?  Walking at a rate of at least 4.5 miles an hour.  Jogging or running at a rate of 5 miles per hour.  Biking at a rate of at least 10 miles per hour.  Lap swimming.  Roller-skating or in-line skating.  Cross-country skiing.  Vigorous competitive sports, such as football, basketball, and  soccer.  Jumping rope.  Aerobic dancing. How can I be more active in my day-to-day activities?  Use the stairs instead of the  elevator.  Take a walk during your lunch break.  If you drive, park your car farther away from work or school.  If you take public transportation, get off one stop early and walk the rest of the way.  Make all of your phone calls while standing up and walking around.  Get up, stretch, and walk around every 30 minutes throughout the day. What guidelines should I follow while exercising?  Do not exercise so much that you hurt yourself, feel dizzy, or get very short of breath.  Consult your health care provider prior to starting a new exercise program.  Wear comfortable clothes and shoes with good support.  Drink plenty of water while you exercise to prevent dehydration or heat stroke. Body water is lost during exercise and must be replaced.  Work out until you breathe faster and your heart beats faster. This information is not intended to replace advice given to you by your health care provider. Make sure you discuss any questions you have with your health care provider. Document Released: 01/12/2011 Document Revised: 05/17/2016 Document Reviewed: 05/13/2014 Elsevier Interactive Patient Education  2018 [employer].   Hypertension Hypertension is another name for high blood pressure. High blood pressure forces your heart to work harder to pump blood. This can cause problems over time. There are two numbers in a blood pressure reading. There is a top number (systolic) over a bottom number (diastolic). It is best to have a blood pressure below 120/80. Healthy choices can help lower your blood pressure. You may need medicine to help lower your blood pressure if:  Your blood pressure cannot be lowered with healthy choices.  Your blood pressure is higher than 130/80.  Follow these instructions at home: Eating and drinking  If directed, follow the DASH  eating plan. This diet includes: ? Filling half of your plate at each meal with fruits and vegetables. ? Filling one quarter of your plate at each meal with whole grains. Whole grains include whole wheat pasta, brown rice, and whole grain bread. ? Eating or drinking low-fat dairy products, such as skim milk or low-fat yogurt. ? Filling one quarter of your plate at each meal with low-fat (lean) proteins. Low-fat proteins include fish, skinless chicken, eggs, beans, and tofu. ? Avoiding fatty meat, cured and processed meat, or chicken with skin. ? Avoiding premade or processed food.  Eat less than 1,500 mg of salt (sodium) a day.  Limit alcohol use to no more than 1 drink a day for nonpregnant women and 2 drinks a day for men. One drink equals 12 oz of beer, 5 oz of wine, or 1 oz of hard liquor. Lifestyle  Work with your doctor to stay at a healthy weight or to lose weight. Ask your doctor what the best weight is for you.  Get at least 30 minutes of exercise that causes your heart to beat faster (aerobic exercise) most days of the week. This may include walking, swimming, or biking.  Get at least 30 minutes of exercise that strengthens your muscles (resistance exercise) at least 3 days a week. This may include lifting weights or pilates.  Do not use any products that contain nicotine or tobacco. This includes cigarettes and e-cigarettes. If you need help quitting, ask your doctor.  Check your blood pressure at home as told by your doctor.  Keep all follow-up visits as told by your doctor. This is important. Medicines  Take over-the-counter and prescription medicines only as told by your doctor.  Follow directions carefully.  Do not skip doses of blood pressure medicine. The medicine does not work as well if you skip doses. Skipping doses also puts you at risk for problems.  Ask your doctor about side effects or reactions to medicines that you should watch for. Contact a doctor if:  You  think you are having a reaction to the medicine you are taking.  You have headaches that keep coming back (recurring).  You feel dizzy.  You have swelling in your ankles.  You have trouble with your vision. Get help right away if:  You get a very bad headache.  You start to feel confused.  You feel weak or numb.  You feel faint.  You get very bad pain in your: ? Chest. ? Belly (abdomen).  You throw up (vomit) more than once.  You have trouble breathing. Summary  Hypertension is another name for high blood pressure.  Making healthy choices can help lower blood pressure. If your blood pressure cannot be controlled with healthy choices, you may need to take medicine. This information is not intended to replace advice given to you by your health care provider. Make sure you discuss any questions you have with your health care provider. Document Released: 05/28/2008 Document Revised: 11/07/2016 Document Reviewed: 11/07/2016 Elsevier Interactive Patient Education  [employer].

## 2018-06-20 ENCOUNTER — Encounter: Payer: BLUE CROSS/BLUE SHIELD | Admitting: Family Medicine

## 2018-06-20 LAB — CBC WITH DIFFERENTIAL/PLATELET
BASOS PCT: 0.7 %
Basophils Absolute: 60 cells/uL (ref 0–200)
EOS PCT: 1.4 %
Eosinophils Absolute: 119 cells/uL (ref 15–500)
HEMATOCRIT: 37.9 % (ref 35.0–45.0)
Hemoglobin: 12.3 g/dL (ref 11.7–15.5)
LYMPHS ABS: 2737 {cells}/uL (ref 850–3900)
MCH: 27.4 pg (ref 27.0–33.0)
MCHC: 32.5 g/dL (ref 32.0–36.0)
MCV: 84.4 fL (ref 80.0–100.0)
MPV: 9.2 fL (ref 7.5–12.5)
Monocytes Relative: 7.3 %
NEUTROS PCT: 58.4 %
Neutro Abs: 4964 cells/uL (ref 1500–7800)
PLATELETS: 262 10*3/uL (ref 140–400)
RBC: 4.49 10*6/uL (ref 3.80–5.10)
RDW: 12.5 % (ref 11.0–15.0)
Total Lymphocyte: 32.2 %
WBC mixed population: 621 cells/uL (ref 200–950)
WBC: 8.5 10*3/uL (ref 3.8–10.8)

## 2018-06-20 LAB — COMPLETE METABOLIC PANEL WITH GFR
AG RATIO: 1.5 (calc) (ref 1.0–2.5)
ALKALINE PHOSPHATASE (APISO): 91 U/L (ref 33–115)
ALT: 12 U/L (ref 6–29)
AST: 11 U/L (ref 10–35)
Albumin: 4 g/dL (ref 3.6–5.1)
BILIRUBIN TOTAL: 0.4 mg/dL (ref 0.2–1.2)
BUN: 16 mg/dL (ref 7–25)
CALCIUM: 9 mg/dL (ref 8.6–10.2)
CHLORIDE: 104 mmol/L (ref 98–110)
CO2: 26 mmol/L (ref 20–32)
Creat: 0.88 mg/dL (ref 0.50–1.10)
GFR, Est African American: 91 mL/min/{1.73_m2} (ref >=60)
GFR, Est Non African American: 78 mL/min/{1.73_m2} (ref >=60)
Globulin: 2.7 g/dL (calc) (ref 1.9–3.7)
Glucose, Bld: 95 mg/dL (ref 65–99)
POTASSIUM: 3.7 mmol/L (ref 3.5–5.3)
Sodium: 140 mmol/L (ref 135–146)
TOTAL PROTEIN: 6.7 g/dL (ref 6.1–8.1)

## 2018-06-20 LAB — LIPID PANEL
Cholesterol: 170 mg/dL (ref <200)
HDL: 54 mg/dL (ref >50)
LDL CHOLESTEROL (CALC): 102 mg/dL — AB
Non-HDL Cholesterol (Calc): 116 mg/dL (calc) (ref <130)
TRIGLYCERIDES: 57 mg/dL (ref <150)
Total CHOL/HDL Ratio: 3.1 (calc) (ref <5.0)

## 2018-06-20 LAB — TSH: TSH: 1.28 mIU/L

## 2018-06-20 LAB — VITAMIN D 25 HYDROXY (VIT D DEFICIENCY, FRACTURES): VIT D 25 HYDROXY: 40 ng/mL (ref 30–100)

## 2018-08-18 ENCOUNTER — Other Ambulatory Visit: Payer: Self-pay | Admitting: Obstetrics and Gynecology

## 2018-08-18 DIAGNOSIS — Z1231 Encounter for screening mammogram for malignant neoplasm of breast: Secondary | ICD-10-CM

## 2018-08-20 DIAGNOSIS — Z01419 Encounter for gynecological examination (general) (routine) without abnormal findings: Secondary | ICD-10-CM | POA: Diagnosis not present

## 2018-08-20 DIAGNOSIS — Z800 Family history of malignant neoplasm of digestive organs: Secondary | ICD-10-CM | POA: Diagnosis not present

## 2018-08-20 DIAGNOSIS — Z1231 Encounter for screening mammogram for malignant neoplasm of breast: Secondary | ICD-10-CM | POA: Diagnosis not present

## 2018-08-20 DIAGNOSIS — Z6841 Body Mass Index (BMI) 40.0 and over, adult: Secondary | ICD-10-CM | POA: Diagnosis not present

## 2018-09-10 ENCOUNTER — Ambulatory Visit
Admission: RE | Admit: 2018-09-10 | Discharge: 2018-09-10 | Disposition: A | Discharge status: Home / Self Care | Payer: BLUE CROSS/BLUE SHIELD | Source: Ambulatory Visit | Attending: Obstetrics and Gynecology | Admitting: Obstetrics and Gynecology

## 2018-09-10 DIAGNOSIS — Z1231 Encounter for screening mammogram for malignant neoplasm of breast: Secondary | ICD-10-CM | POA: Diagnosis not present

## 2019-06-12 ENCOUNTER — Other Ambulatory Visit: Payer: Self-pay | Admitting: Family Medicine

## 2019-06-12 DIAGNOSIS — I10 Essential (primary) hypertension: Secondary | ICD-10-CM

## 2019-06-23 DIAGNOSIS — H93299 Other abnormal auditory perceptions, unspecified ear: Secondary | ICD-10-CM | POA: Diagnosis not present

## 2019-06-23 DIAGNOSIS — J301 Allergic rhinitis due to pollen: Secondary | ICD-10-CM | POA: Diagnosis not present

## 2019-08-04 ENCOUNTER — Other Ambulatory Visit: Payer: Self-pay

## 2019-08-04 DIAGNOSIS — Z20822 Contact with and (suspected) exposure to covid-19: Secondary | ICD-10-CM

## 2019-08-05 LAB — NOVEL CORONAVIRUS, NAA: SARS-CoV-2, NAA: NOT DETECTED

## 2019-10-02 ENCOUNTER — Other Ambulatory Visit: Payer: Self-pay | Admitting: Obstetrics and Gynecology

## 2019-10-02 DIAGNOSIS — Z1231 Encounter for screening mammogram for malignant neoplasm of breast: Secondary | ICD-10-CM

## 2019-11-12 ENCOUNTER — Other Ambulatory Visit: Payer: Self-pay

## 2019-11-12 ENCOUNTER — Ambulatory Visit
Admission: RE | Admit: 2019-11-12 | Discharge: 2019-11-12 | Disposition: A | Discharge status: Home / Self Care | Payer: BC Managed Care – PPO | Source: Ambulatory Visit | Attending: Obstetrics and Gynecology | Admitting: Obstetrics and Gynecology

## 2019-11-12 DIAGNOSIS — Z1231 Encounter for screening mammogram for malignant neoplasm of breast: Secondary | ICD-10-CM | POA: Diagnosis not present

## 2019-11-16 ENCOUNTER — Ambulatory Visit: Payer: BLUE CROSS/BLUE SHIELD

## 2019-11-18 ENCOUNTER — Other Ambulatory Visit: Payer: Self-pay | Admitting: Obstetrics and Gynecology

## 2019-11-18 DIAGNOSIS — R928 Other abnormal and inconclusive findings on diagnostic imaging of breast: Secondary | ICD-10-CM

## 2019-11-23 DIAGNOSIS — Z1231 Encounter for screening mammogram for malignant neoplasm of breast: Secondary | ICD-10-CM | POA: Diagnosis not present

## 2019-11-23 DIAGNOSIS — Z01419 Encounter for gynecological examination (general) (routine) without abnormal findings: Secondary | ICD-10-CM | POA: Diagnosis not present

## 2019-11-23 DIAGNOSIS — Z1211 Encounter for screening for malignant neoplasm of colon: Secondary | ICD-10-CM | POA: Diagnosis not present

## 2019-11-23 DIAGNOSIS — Z6841 Body Mass Index (BMI) 40.0 and over, adult: Secondary | ICD-10-CM | POA: Diagnosis not present

## 2019-11-24 ENCOUNTER — Ambulatory Visit
Admission: RE | Admit: 2019-11-24 | Discharge: 2019-11-24 | Disposition: A | Discharge status: Home / Self Care | Payer: BC Managed Care – PPO | Source: Ambulatory Visit | Attending: Obstetrics and Gynecology | Admitting: Obstetrics and Gynecology

## 2019-11-24 ENCOUNTER — Ambulatory Visit: Payer: BC Managed Care – PPO

## 2019-11-24 ENCOUNTER — Other Ambulatory Visit: Payer: Self-pay

## 2019-11-24 DIAGNOSIS — R928 Other abnormal and inconclusive findings on diagnostic imaging of breast: Secondary | ICD-10-CM | POA: Diagnosis not present

## 2019-12-01 DIAGNOSIS — Z1211 Encounter for screening for malignant neoplasm of colon: Secondary | ICD-10-CM | POA: Diagnosis not present

## 2019-12-01 LAB — HM COLONOSCOPY

## 2019-12-22 ENCOUNTER — Ambulatory Visit (INDEPENDENT_AMBULATORY_CARE_PROVIDER_SITE_OTHER): Payer: BLUE CROSS/BLUE SHIELD | Admitting: Family Medicine

## 2019-12-22 ENCOUNTER — Encounter: Payer: Self-pay | Admitting: Family Medicine

## 2019-12-22 ENCOUNTER — Other Ambulatory Visit: Payer: Self-pay

## 2019-12-22 VITALS — BP 140/86 | HR 93 | Temp 97.8°F | Resp 16 | Ht 68.0 in | Wt 266.5 lb

## 2019-12-22 DIAGNOSIS — I10 Essential (primary) hypertension: Secondary | ICD-10-CM

## 2019-12-22 DIAGNOSIS — Z13220 Encounter for screening for lipoid disorders: Secondary | ICD-10-CM

## 2019-12-22 DIAGNOSIS — Z6841 Body Mass Index (BMI) 40.0 and over, adult: Secondary | ICD-10-CM

## 2019-12-22 NOTE — Progress Notes (Signed)
   Subjective:    Patient ID: Sara Hickman, female    DOB: Apr 12, 1971, 48 y.o.   MRN: [id number]  Patient presents for Hypertension   Pt here to f/u chronic medical problems. Medications reviewed    HTN- she has been taking HCTZ 25mg  once a day,  weight up 12lbs since last year.  She admits that she has been snacking more not as active.     Mammogram UTD    PAP Smear UTD - Dr. Cletis Media    She was seen by Dr. Collene Mares for colon cancer screening referred by her gynecologist.  [facility] had preop labs told creatinine was a little elevated she does not recall what the number was.  Her blood pressure is also been a little elevated.      Review Of Systems:  GEN- denies fatigue, fever, weight loss,weakness, recent illness HEENT- denies eye drainage, change in vision, nasal discharge, CVS- denies chest pain, palpitations RESP- denies SOB, cough, wheeze ABD- denies N/V, change in stools, abd pain GU- denies dysuria, hematuria, dribbling, incontinence MSK- denies joint pain, muscle aches, injury Neuro- denies headache, dizziness, syncope, seizure activity       Objective:    BP 140/86   Pulse 93   Temp 97.8 F (36.6 C) (Temporal)   Resp 16   Ht 5\' 8"  (1.727 m)   Wt 266 lb 8 oz (120.9 kg)   LMP 09/24/2012   SpO2 97%   BMI 40.52 kg/m  GEN- NAD, alert and oriented x3 HEENT- PERRL, EOMI, non injected sclera, pink conjunctiva, Neck- Supple, no thyromegaly CVS- RRR, no murmur RESP-CTAB ABD-NABS,soft,NT,ND EXT- No edema Pulses- Radial, DP- 2+        Assessment & Plan:      Problem List Items Addressed This Visit      Unprioritized   Essential hypertension, benign - Primary    Blood pressure is elevated today.  Would like to see it less than 130/80.  Discussed some dietary changes that she can make decreasing her portion size cutting out the salt.  Have her monitor her blood pressure at home.  Check her labs today including her creatinine TSH to see if there is some  type of metabolic disturbance along with her elevated blood pressure.  We will have a telehealth visit in 4 weeks as she makes the dietary changes and see what her blood pressure is running before we make changes to her medications, unless  she has something significant on her labs      Relevant Orders   CBC with Differential   Comprehensive metabolic panel   Lipid Panel   TSH   Obesity   Relevant Orders   Lipid Panel   TSH    Other Visit Diagnoses    Screening cholesterol level          Note: This dictation was prepared with Dragon dictation along with smaller phrase technology. Any transcriptional errors that result from this process are unintentional.

## 2019-12-22 NOTE — Patient Instructions (Addendum)
Blood pressure at home goal < 130/80 F/U 4 WEEKS telehealth

## 2019-12-22 NOTE — Assessment & Plan Note (Signed)
Blood pressure is elevated today.  Would like to see it less than 130/80.  Discussed some dietary changes that she can make decreasing her portion size cutting out the salt.  Have her monitor her blood pressure at home.  Check her labs today including her creatinine TSH to see if there is some type of metabolic disturbance along with her elevated blood pressure.  We will have a telehealth visit in 4 weeks as she makes the dietary changes and see what her blood pressure is running before we make changes to her medications, unless  she has something significant on her labs

## 2019-12-23 LAB — COMPREHENSIVE METABOLIC PANEL
AG Ratio: 1.6 (calc) (ref 1.0–2.5)
ALT: 17 U/L (ref 6–29)
AST: 16 U/L (ref 10–35)
Albumin: 4.4 g/dL (ref 3.6–5.1)
Alkaline phosphatase (APISO): 99 U/L (ref 31–125)
BUN: 17 mg/dL (ref 7–25)
CO2: 27 mmol/L (ref 20–32)
Calcium: 9.6 mg/dL (ref 8.6–10.2)
Chloride: 103 mmol/L (ref 98–110)
Creat: 0.81 mg/dL (ref 0.50–1.10)
Globulin: 2.7 g/dL (calc) (ref 1.9–3.7)
Glucose, Bld: 87 mg/dL (ref 65–99)
Potassium: 3.7 mmol/L (ref 3.5–5.3)
Sodium: 142 mmol/L (ref 135–146)
Total Bilirubin: 0.5 mg/dL (ref 0.2–1.2)
Total Protein: 7.1 g/dL (ref 6.1–8.1)

## 2019-12-23 LAB — CBC WITH DIFFERENTIAL/PLATELET
Absolute Monocytes: 488 cells/uL (ref 200–950)
Basophils Absolute: 60 cells/uL (ref 0–200)
Basophils Relative: 0.8 %
Eosinophils Absolute: 90 cells/uL (ref 15–500)
Eosinophils Relative: 1.2 %
HCT: 41.5 % (ref 35.0–45.0)
Hemoglobin: 13.3 g/dL (ref 11.7–15.5)
Lymphs Abs: 2610 cells/uL (ref 850–3900)
MCH: 27.1 pg (ref 27.0–33.0)
MCHC: 32 g/dL (ref 32.0–36.0)
MCV: 84.5 fL (ref 80.0–100.0)
MPV: 9.3 fL (ref 7.5–12.5)
Monocytes Relative: 6.5 %
Neutro Abs: 4253 cells/uL (ref 1500–7800)
Neutrophils Relative %: 56.7 %
Platelets: 265 10*3/uL (ref 140–400)
RBC: 4.91 10*6/uL (ref 3.80–5.10)
RDW: 12.6 % (ref 11.0–15.0)
Total Lymphocyte: 34.8 %
WBC: 7.5 10*3/uL (ref 3.8–10.8)

## 2019-12-23 LAB — LIPID PANEL
Cholesterol: 171 mg/dL (ref <200)
HDL: 54 mg/dL (ref >=50)
LDL Cholesterol (Calc): 101 mg/dL (calc) — ABNORMAL HIGH
Non-HDL Cholesterol (Calc): 117 mg/dL (calc) (ref <130)
Total CHOL/HDL Ratio: 3.2 (calc) (ref <5.0)
Triglycerides: 74 mg/dL (ref <150)

## 2019-12-23 LAB — TSH: TSH: 1.17 mIU/L

## 2020-01-08 DIAGNOSIS — Z1211 Encounter for screening for malignant neoplasm of colon: Secondary | ICD-10-CM | POA: Diagnosis not present

## 2020-01-08 DIAGNOSIS — K635 Polyp of colon: Secondary | ICD-10-CM | POA: Diagnosis not present

## 2020-01-08 DIAGNOSIS — D125 Benign neoplasm of sigmoid colon: Secondary | ICD-10-CM | POA: Diagnosis not present

## 2020-01-19 ENCOUNTER — Encounter: Payer: Self-pay | Admitting: Family Medicine

## 2020-01-19 ENCOUNTER — Telehealth (INDEPENDENT_AMBULATORY_CARE_PROVIDER_SITE_OTHER): Payer: Self-pay | Admitting: Family Medicine

## 2020-01-19 VITALS — Wt 255.0 lb

## 2020-01-19 DIAGNOSIS — Z6839 Body mass index (BMI) 39.0-39.9, adult: Secondary | ICD-10-CM

## 2020-01-19 DIAGNOSIS — I10 Essential (primary) hypertension: Secondary | ICD-10-CM

## 2020-01-19 MED ORDER — AMLODIPINE BESYLATE 5 MG PO TABS: 5.0000 mg | ORAL_TABLET | Freq: Every day | ORAL | 3 refills | Status: DC

## 2020-01-19 NOTE — Progress Notes (Signed)
Virtual Visit via Video Note  I connected with Sara Hickman on 01/19/20 at 8:06am  by a telephone as unable to video  application to work and verified that I am speaking with the correct person using two identifiers.      Pt location: at home   Physician location:  In office, [facility], Vic Blackbird MD     On call: patient and physician   I discussed the limitations of evaluation and management by telemedicine and the availability of in person appointments. The patient expressed understanding and agreed to proceed.  History of Present Illness: Health visit to follow-up hypertension.  The last visit blood pressure was elevated 140/86 a month ago.  Her weight was up 12 pounds.  She been taking hydrochlorothiazide 25 mg once a day.  Discussed some dietary changes that she can work on to help reduce her weight as well as her blood pressure before making changes in medication.  Labs were normal including her TSH and renal function She has not had any headaches, CHest pain, SOB, no recent illness since our last visit   Her BP readings - yesterday 150/88   /  Today  140/87  Weight 255lbs today, she is currently on [employer] Fast with her church for 21 days, she is doing intermittent fasting  From 12-8pm. Feels that she is not eating as much as she usually does with this change in her diet   Observations/Objective: NAD noted over the phone   Assessment and Plan: HTN- continue HCTZ, add norvasc 5mg  once a day  Goal BP < 130/80, recheck in 2 months   Obesity- weight decreasing in setting of intermittent fasting which she wishes to continue and plant based diet she is following for church. We discussed how high fiber diets can cause you to feel fuller and are rich in veggies which is  Overall more heart healthy for her. She will continue incorporating some of these healthy changes   Follow Up Instructions:    I discussed the assessment and treatment plan with the  patient. The patient was provided an opportunity to ask questions and all were answered. The patient agreed with the plan and demonstrated an understanding of the instructions.   The patient was advised to call back or seek an in-person evaluation if the symptoms worsen or if the condition fails to improve as anticipated.  I provided  13 minutes of non-face-to-face time during this encounter. End Time: 8:19am   Vic Blackbird, MD

## 2020-03-18 ENCOUNTER — Ambulatory Visit: Payer: Self-pay | Admitting: Family Medicine

## 2020-03-22 ENCOUNTER — Other Ambulatory Visit: Payer: Self-pay | Admitting: Family Medicine

## 2020-03-22 DIAGNOSIS — I10 Essential (primary) hypertension: Secondary | ICD-10-CM

## 2020-03-30 ENCOUNTER — Encounter: Payer: Self-pay | Admitting: Family Medicine

## 2020-03-30 ENCOUNTER — Other Ambulatory Visit: Payer: Self-pay

## 2020-03-30 ENCOUNTER — Ambulatory Visit (INDEPENDENT_AMBULATORY_CARE_PROVIDER_SITE_OTHER): Payer: BC Managed Care – PPO | Admitting: Family Medicine

## 2020-03-30 VITALS — BP 134/82 | HR 86 | Temp 98.0°F | Resp 14 | Ht 68.0 in | Wt 251.0 lb

## 2020-03-30 DIAGNOSIS — E66812 Obesity, class 2: Secondary | ICD-10-CM

## 2020-03-30 DIAGNOSIS — Z6838 Body mass index (BMI) 38.0-38.9, adult: Secondary | ICD-10-CM | POA: Diagnosis not present

## 2020-03-30 DIAGNOSIS — I10 Essential (primary) hypertension: Secondary | ICD-10-CM | POA: Diagnosis not present

## 2020-03-30 DIAGNOSIS — J301 Allergic rhinitis due to pollen: Secondary | ICD-10-CM

## 2020-03-30 MED ORDER — FLUTICASONE PROPIONATE 50 MCG/ACT NA SUSP: 2.0000 | Freq: Every day | NASAL | 2 refills | Status: DC

## 2020-03-30 MED ORDER — AMLODIPINE BESYLATE 5 MG PO TABS: 5.0000 mg | ORAL_TABLET | Freq: Every day | ORAL | 1 refills | Status: DC

## 2020-03-30 NOTE — Patient Instructions (Signed)
F/U 4 months  

## 2020-03-30 NOTE — Assessment & Plan Note (Signed)
Congratulated on weight loss.She is adjusting to plant based diet with good results Add walking-weight bearing exercise

## 2020-03-30 NOTE — Progress Notes (Signed)
   Subjective:    Patient ID: Sara Hickman, female    DOB: 1971-08-03, 49 y.o.   MRN: [id number]  Patient presents for Follow-up (is fasting)  Patient had a follow-up chronic medical problems.  Medications reviewed.  Hypertension she is taking hydrochlorothiazide 25 mg once a day along with amlodipine 5 mg once a day which was added via telehealth visit in January due to continued elevated readings..  Her blood pressure readings at home now range from 130-140/ 70-80 She thinks amlodipine may cause a mild headache but also has allergy symptoms itchy eyes, post nasal drip. Nothing that is not tolerable, No swelling with the medication   Obesity she has been working on dietary changes for the past few months.  Her weight at her last visit was 266 pounds in December with a BMI of 40.5 She has cut down on carbs, eating more veggies Plans to start walking   Her lipid panel was essentially normal LDL was 101 fasting glucose was 87 thyroid also normal Reviewed labs with her at bedside     Review Of Systems:  GEN- denies fatigue, fever, weight loss,weakness, recent illness HEENT- denies eye drainage, change in vision, nasal discharge, CVS- denies chest pain, palpitations RESP- denies SOB, cough, wheeze ABD- denies N/V, change in stools, abd pain GU- denies dysuria, hematuria, dribbling, incontinence MSK- denies joint pain, muscle aches, injury Neuro- denies headache, dizziness, syncope, seizure activity       Objective:    BP 134/82   Pulse 86   Temp 98 F (36.7 C) (Temporal)   Resp 14   Ht 5\' 8"  (1.727 m)   Wt 251 lb (113.9 kg)   LMP 09/24/2012   SpO2 97%   BMI 38.16 kg/m  GEN- NAD, alert and oriented x3 HEENT- PERRL, EOMI, non injected sclera, pink conjunctiva,  Neck- Supple, no thyromegaly CVS- RRR, no murmur RESP-CTAB ABD-NABS,soft,NT,ND EXT- No edema Pulses- Radial, DP- 2+        Assessment & Plan:      Problem List Items Addressed This Visit       Unprioritized   Allergic rhinitis    Restart flonase and oral anti-histamine for allergy season      Essential hypertension, benign - Primary    bp much improved with addition of norvasc Will have her try taking norvasc at bedtime IF still getting HA, then will change losartan 25mg  once a day       Relevant Medications   amLODipine (NORVASC) 5 MG tablet   Obesity    Congratulated on weight loss.She is adjusting to plant based diet with good results Add walking-weight bearing exercise          Note: This dictation was prepared with Dragon dictation along with smaller phrase technology. Any transcriptional errors that result from this process are unintentional.

## 2020-03-30 NOTE — Assessment & Plan Note (Signed)
bp much improved with addition of norvasc Will have her try taking norvasc at bedtime IF still getting HA, then will change losartan 25mg  once a day

## 2020-03-30 NOTE — Assessment & Plan Note (Signed)
Restart flonase and oral anti-histamine for allergy season

## 2020-04-01 ENCOUNTER — Other Ambulatory Visit: Payer: Self-pay | Admitting: Family Medicine

## 2020-04-01 DIAGNOSIS — I10 Essential (primary) hypertension: Secondary | ICD-10-CM

## 2020-04-04 ENCOUNTER — Other Ambulatory Visit: Payer: Self-pay | Admitting: *Deleted

## 2020-04-04 DIAGNOSIS — I10 Essential (primary) hypertension: Secondary | ICD-10-CM

## 2020-04-04 MED ORDER — HYDROCHLOROTHIAZIDE 25 MG PO TABS: 25.0000 mg | ORAL_TABLET | Freq: Every day | ORAL | 2 refills | Status: DC

## 2020-04-07 DIAGNOSIS — N390 Urinary tract infection, site not specified: Secondary | ICD-10-CM | POA: Diagnosis not present

## 2020-07-13 DIAGNOSIS — L259 Unspecified contact dermatitis, unspecified cause: Secondary | ICD-10-CM | POA: Diagnosis not present

## 2020-08-03 ENCOUNTER — Other Ambulatory Visit: Payer: Self-pay

## 2020-08-03 ENCOUNTER — Ambulatory Visit (INDEPENDENT_AMBULATORY_CARE_PROVIDER_SITE_OTHER): Payer: BC Managed Care – PPO | Admitting: Family Medicine

## 2020-08-03 ENCOUNTER — Encounter: Payer: Self-pay | Admitting: Family Medicine

## 2020-08-03 VITALS — BP 128/84 | HR 84 | Temp 98.3°F | Resp 14 | Ht 68.0 in | Wt 257.0 lb

## 2020-08-03 DIAGNOSIS — I10 Essential (primary) hypertension: Secondary | ICD-10-CM | POA: Diagnosis not present

## 2020-08-03 DIAGNOSIS — Z1159 Encounter for screening for other viral diseases: Secondary | ICD-10-CM

## 2020-08-03 DIAGNOSIS — Z6839 Body mass index (BMI) 39.0-39.9, adult: Secondary | ICD-10-CM

## 2020-08-03 NOTE — Patient Instructions (Signed)
F/U 4 months for Physical  

## 2020-08-03 NOTE — Assessment & Plan Note (Signed)
Controlled no changes, check BMET, A1C with obesity No change to meds

## 2020-08-03 NOTE — Assessment & Plan Note (Signed)
Discussed meal prepping Restarting walking  Plant based diet worked well for her Continue vitamin D, B12, MVI

## 2020-08-03 NOTE — Progress Notes (Signed)
   Subjective:    Patient ID: Sara Hickman, female    DOB: 12/14/1971, 49 y.o.   MRN: [id number]  Patient presents for Follow-up (is fasting)  Patient here to follow-up on medical problems.  Medications reviewed  HTN- taking noravsc 5mg  and HCTZ 25mg  daily BP ranges- bp at home 120's/ 80's at home  she did have Headache with norvasc but that resolved   Obesity- she had been on plant based diet and walking for exercise   weight at last visit was  251lbs in April She admits to dietary indiscretions and not exercising  she plans to get back on track    Review Of Systems:  GEN- denies fatigue, fever, weight loss,weakness, recent illness HEENT- denies eye drainage, change in vision, nasal discharge, CVS- denies chest pain, palpitations RESP- denies SOB, cough, wheeze ABD- denies N/V, change in stools, abd pain GU- denies dysuria, hematuria, dribbling, incontinence MSK- denies joint pain, muscle aches, injury Neuro- denies headache, dizziness, syncope, seizure activity       Objective:    BP 128/84   Pulse 84   Temp 98.3 F (36.8 C) (Temporal)   Resp 14   Ht 5\' 8"  (1.727 m)   Wt 257 lb (116.6 kg)   LMP 09/24/2012   SpO2 96%   BMI 39.08 kg/m  GEN- NAD, alert and oriented x3 HEENT- PERRL, EOMI, non injected sclera, pink conjunctiva, MMM, oropharynx clear Neck- Supple, no thyromegaly CVS- RRR, no murmur RESP-CTAB ABD-NABS,soft,NT,ND EXT- No edema Pulses- Radial, DP- 2+        Assessment & Plan:      Problem List Items Addressed This Visit      Unprioritized   Essential hypertension, benign - Primary    Controlled no changes, check BMET, A1C with obesity No change to meds       Relevant Orders   Basic metabolic panel   Obesity    Discussed meal prepping Restarting walking  Plant based diet worked well for her Continue vitamin D, B12, MVI      Relevant Orders   Hemoglobin A1c    Other Visit Diagnoses    Need for hepatitis C screening  test       Relevant Orders   Hepatitis C antibody      Note: This dictation was prepared with Dragon dictation along with smaller phrase technology. Any transcriptional errors that result from this process are unintentional.

## 2020-08-04 LAB — BASIC METABOLIC PANEL
BUN: 19 mg/dL (ref 7–25)
CO2: 26 mmol/L (ref 20–32)
Calcium: 9.6 mg/dL (ref 8.6–10.2)
Chloride: 102 mmol/L (ref 98–110)
Creat: 0.81 mg/dL (ref 0.50–1.10)
Glucose, Bld: 90 mg/dL (ref 65–99)
Potassium: 3.8 mmol/L (ref 3.5–5.3)
Sodium: 141 mmol/L (ref 135–146)

## 2020-08-04 LAB — HEPATITIS C ANTIBODY
Hepatitis C Ab: NONREACTIVE
SIGNAL TO CUT-OFF: 0.01 (ref <1.00)

## 2020-08-04 LAB — HEMOGLOBIN A1C
Hgb A1c MFr Bld: 5.7 % of total Hgb — ABNORMAL HIGH (ref <5.7)
Mean Plasma Glucose: 117 (calc)
eAG (mmol/L): 6.5 (calc)

## 2020-11-03 ENCOUNTER — Other Ambulatory Visit: Payer: Self-pay | Admitting: Obstetrics and Gynecology

## 2020-11-03 DIAGNOSIS — Z1231 Encounter for screening mammogram for malignant neoplasm of breast: Secondary | ICD-10-CM

## 2020-11-24 DIAGNOSIS — Z01419 Encounter for gynecological examination (general) (routine) without abnormal findings: Secondary | ICD-10-CM | POA: Diagnosis not present

## 2020-11-24 DIAGNOSIS — Z1211 Encounter for screening for malignant neoplasm of colon: Secondary | ICD-10-CM | POA: Diagnosis not present

## 2020-11-24 DIAGNOSIS — Z6841 Body Mass Index (BMI) 40.0 and over, adult: Secondary | ICD-10-CM | POA: Diagnosis not present

## 2020-11-24 DIAGNOSIS — Z1231 Encounter for screening mammogram for malignant neoplasm of breast: Secondary | ICD-10-CM | POA: Diagnosis not present

## 2020-12-14 ENCOUNTER — Other Ambulatory Visit: Payer: Self-pay | Admitting: Family Medicine

## 2020-12-14 ENCOUNTER — Ambulatory Visit: Payer: Self-pay

## 2021-01-18 ENCOUNTER — Other Ambulatory Visit: Payer: Self-pay | Admitting: Family Medicine

## 2021-01-18 DIAGNOSIS — I10 Essential (primary) hypertension: Secondary | ICD-10-CM

## 2021-01-20 ENCOUNTER — Ambulatory Visit: Payer: Self-pay

## 2021-01-24 ENCOUNTER — Ambulatory Visit
Admission: RE | Admit: 2021-01-24 | Discharge: 2021-01-24 | Disposition: A | Discharge status: Home / Self Care | Payer: BC Managed Care – PPO | Source: Ambulatory Visit | Attending: Obstetrics and Gynecology | Admitting: Obstetrics and Gynecology

## 2021-01-24 ENCOUNTER — Other Ambulatory Visit: Payer: Self-pay

## 2021-01-24 DIAGNOSIS — Z1231 Encounter for screening mammogram for malignant neoplasm of breast: Secondary | ICD-10-CM | POA: Diagnosis not present

## 2021-01-27 ENCOUNTER — Ambulatory Visit: Payer: Self-pay

## 2021-02-07 ENCOUNTER — Encounter: Payer: Self-pay | Admitting: Family Medicine

## 2021-02-07 ENCOUNTER — Other Ambulatory Visit: Payer: Self-pay

## 2021-02-07 ENCOUNTER — Ambulatory Visit (INDEPENDENT_AMBULATORY_CARE_PROVIDER_SITE_OTHER): Payer: BC Managed Care – PPO | Admitting: Family Medicine

## 2021-02-07 VITALS — BP 132/78 | HR 80 | Temp 97.9°F | Resp 16 | Ht 68.0 in | Wt 254.0 lb

## 2021-02-07 DIAGNOSIS — I10 Essential (primary) hypertension: Secondary | ICD-10-CM

## 2021-02-07 DIAGNOSIS — R7303 Prediabetes: Secondary | ICD-10-CM

## 2021-02-07 DIAGNOSIS — K219 Gastro-esophageal reflux disease without esophagitis: Secondary | ICD-10-CM | POA: Diagnosis not present

## 2021-02-07 DIAGNOSIS — J301 Allergic rhinitis due to pollen: Secondary | ICD-10-CM

## 2021-02-07 DIAGNOSIS — Z6838 Body mass index (BMI) 38.0-38.9, adult: Secondary | ICD-10-CM

## 2021-02-07 MED ORDER — HYDROCHLOROTHIAZIDE 25 MG PO TABS: 25.0000 mg | ORAL_TABLET | Freq: Every day | ORAL | 2 refills | Status: DC

## 2021-02-07 MED ORDER — CETIRIZINE HCL 10 MG PO TABS: 10.0000 mg | ORAL_TABLET | Freq: Every day | ORAL | 11 refills | Status: DC

## 2021-02-07 MED ORDER — AMLODIPINE BESYLATE 5 MG PO TABS: 5.0000 mg | ORAL_TABLET | Freq: Every day | ORAL | 3 refills | Status: DC

## 2021-02-07 MED ORDER — SHINGRIX 50 MCG/0.5ML IM SUSR: 0.5000 mL | Freq: Once | INTRAMUSCULAR | 1 refills | Status: AC

## 2021-02-07 MED ORDER — ESOMEPRAZOLE MAGNESIUM 40 MG PO CPDR
40.0000 mg | DELAYED_RELEASE_CAPSULE | Freq: Every day | ORAL | 3 refills | Status: DC
Start: 2021-02-07 — End: 2021-11-21

## 2021-02-07 MED ORDER — FLUTICASONE PROPIONATE 50 MCG/ACT NA SUSP: 2.0000 | Freq: Every day | NASAL | 2 refills | Status: DC

## 2021-02-07 NOTE — Assessment & Plan Note (Signed)
Check A1C 

## 2021-02-07 NOTE — Assessment & Plan Note (Signed)
Discussed reducing carbs/sugars

## 2021-02-07 NOTE — Assessment & Plan Note (Signed)
Controlled no changes 

## 2021-02-07 NOTE — Progress Notes (Signed)
   Subjective:    Patient ID: Sara Hickman, female    DOB: [birth year], 50 y.o.   MRN: [id number]  Patient presents for Medication Review/ Refill (Is fasting/)    Pt here to HTN taking norvas and HCTZ    She had a severe case of reflux that worse when laying down. She took a dose of pepcid and used antacid   she does have history of reflux , was on nexium in the past    Pre diabetes, last A1C 5.7% in August    Colonoscopy done DR, Mann 2020    Review Of Systems:  GEN- denies fatigue, fever, weight loss,weakness, recent illness HEENT- denies eye drainage, change in vision, nasal discharge, CVS- denies chest pain, palpitations RESP- denies SOB, cough, wheeze ABD- denies N/V, change in stools, abd pain GU- denies dysuria, hematuria, dribbling, incontinence MSK- denies joint pain, muscle aches, injury Neuro- denies headache, dizziness, syncope, seizure activity       Objective:    BP 132/78   Pulse 80   Temp 97.9 F (36.6 C) (Temporal)   Resp 16   Ht 5\' 8"  (1.727 m)   Wt 254 lb (115.2 kg)   LMP 09/24/2012   SpO2 97%   BMI 38.62 kg/m  GEN- NAD, alert and oriented x3 HEENT- PERRL, EOMI, non injected sclera, pink conjunctiva, MMM, oropharynx clear Neck- Supple, no thyromegaly CVS- RRR, no murmur RESP-CTAB ABD-NABS,soft,NT,ND EXT- No edema Pulses- Radial, DP- 2+        Assessment & Plan:      Problem List Items Addressed This Visit      Unprioritized   Allergic rhinitis   Relevant Medications   cetirizine (ZYRTEC) 10 MG tablet   Essential hypertension, benign    Controlled no changes       Relevant Medications   hydrochlorothiazide (HYDRODIURIL) 25 MG tablet   amLODipine (NORVASC) 5 MG tablet   Other Relevant Orders   CBC with Differential/Platelet   Comprehensive metabolic panel   Lipid panel   GERD (gastroesophageal reflux disease) - Primary    Refilled nexium Discussed dietary changes to reduce acid      Relevant Medications    esomeprazole (NEXIUM) 40 MG capsule   Obesity    Discussed reducing carbs/sugars      Pre-diabetes    Check A1C       Relevant Orders   Hemoglobin A1c      Note: This dictation was prepared with Dragon dictation along with smaller phrase technology. Any transcriptional errors that result from this process are unintentional.

## 2021-02-07 NOTE — Assessment & Plan Note (Signed)
Refilled nexium Discussed dietary changes to reduce acid

## 2021-02-08 LAB — COMPREHENSIVE METABOLIC PANEL
AG Ratio: 1.6 (calc) (ref 1.0–2.5)
ALT: 18 U/L (ref 6–29)
AST: 12 U/L (ref 10–35)
Albumin: 4.2 g/dL (ref 3.6–5.1)
Alkaline phosphatase (APISO): 118 U/L (ref 37–153)
BUN: 19 mg/dL (ref 7–25)
CO2: 30 mmol/L (ref 20–32)
Calcium: 9.7 mg/dL (ref 8.6–10.4)
Chloride: 105 mmol/L (ref 98–110)
Creat: 0.77 mg/dL (ref 0.50–1.05)
Globulin: 2.7 g/dL (calc) (ref 1.9–3.7)
Glucose, Bld: 96 mg/dL (ref 65–99)
Potassium: 4.1 mmol/L (ref 3.5–5.3)
Sodium: 143 mmol/L (ref 135–146)
Total Bilirubin: 0.4 mg/dL (ref 0.2–1.2)
Total Protein: 6.9 g/dL (ref 6.1–8.1)

## 2021-02-08 LAB — CBC WITH DIFFERENTIAL/PLATELET
Absolute Monocytes: 530 cells/uL (ref 200–950)
Basophils Absolute: 62 cells/uL (ref 0–200)
Basophils Relative: 0.8 %
Eosinophils Absolute: 133 cells/uL (ref 15–500)
Eosinophils Relative: 1.7 %
HCT: 39.5 % (ref 35.0–45.0)
Hemoglobin: 12.9 g/dL (ref 11.7–15.5)
Lymphs Abs: 2917 cells/uL (ref 850–3900)
MCH: 27.7 pg (ref 27.0–33.0)
MCHC: 32.7 g/dL (ref 32.0–36.0)
MCV: 84.8 fL (ref 80.0–100.0)
MPV: 9.3 fL (ref 7.5–12.5)
Monocytes Relative: 6.8 %
Neutro Abs: 4157 cells/uL (ref 1500–7800)
Neutrophils Relative %: 53.3 %
Platelets: 303 10*3/uL (ref 140–400)
RBC: 4.66 10*6/uL (ref 3.80–5.10)
RDW: 13 % (ref 11.0–15.0)
Total Lymphocyte: 37.4 %
WBC: 7.8 10*3/uL (ref 3.8–10.8)

## 2021-02-08 LAB — LIPID PANEL
Cholesterol: 171 mg/dL (ref <200)
HDL: 57 mg/dL (ref >=50)
LDL Cholesterol (Calc): 98 mg/dL (calc)
Non-HDL Cholesterol (Calc): 114 mg/dL (calc) (ref <130)
Total CHOL/HDL Ratio: 3 (calc) (ref <5.0)
Triglycerides: 69 mg/dL (ref <150)

## 2021-02-08 LAB — HEMOGLOBIN A1C
Hgb A1c MFr Bld: 5.7 % of total Hgb — ABNORMAL HIGH (ref <5.7)
Mean Plasma Glucose: 117 mg/dL
eAG (mmol/L): 6.5 mmol/L

## 2021-02-09 ENCOUNTER — Encounter: Payer: Self-pay | Admitting: *Deleted

## 2021-03-02 ENCOUNTER — Ambulatory Visit: Payer: Self-pay

## 2021-03-27 DIAGNOSIS — J069 Acute upper respiratory infection, unspecified: Secondary | ICD-10-CM | POA: Diagnosis not present

## 2021-03-29 DIAGNOSIS — J019 Acute sinusitis, unspecified: Secondary | ICD-10-CM | POA: Diagnosis not present

## 2021-05-01 DIAGNOSIS — K219 Gastro-esophageal reflux disease without esophagitis: Secondary | ICD-10-CM | POA: Diagnosis not present

## 2021-05-01 DIAGNOSIS — R7303 Prediabetes: Secondary | ICD-10-CM | POA: Diagnosis not present

## 2021-05-01 DIAGNOSIS — I10 Essential (primary) hypertension: Secondary | ICD-10-CM | POA: Diagnosis not present

## 2021-05-01 DIAGNOSIS — E669 Obesity, unspecified: Secondary | ICD-10-CM | POA: Diagnosis not present

## 2021-08-17 IMAGING — MG MM DIGITAL DIAGNOSTIC UNILAT*R* W/ TOMO W/ CAD
6 series · 6 of 18 positions shown · non-contrast
Comparison: Previous exam(s).

CLINICAL DATA: Screening recall for a right breast asymmetry.

EXAM:
DIGITAL DIAGNOSTIC UNILATERAL RIGHT MAMMOGRAM WITH CAD AND TOMO

[R MLO synth-2D]
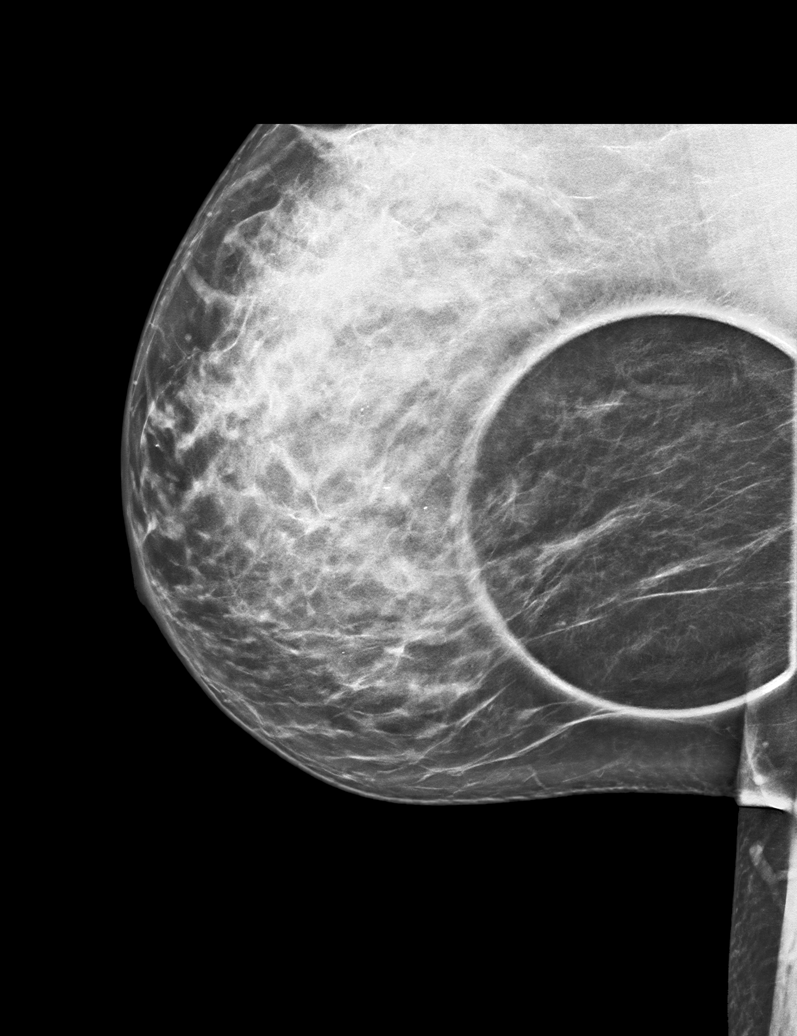

[R CC synth-2D (1 of 2)]
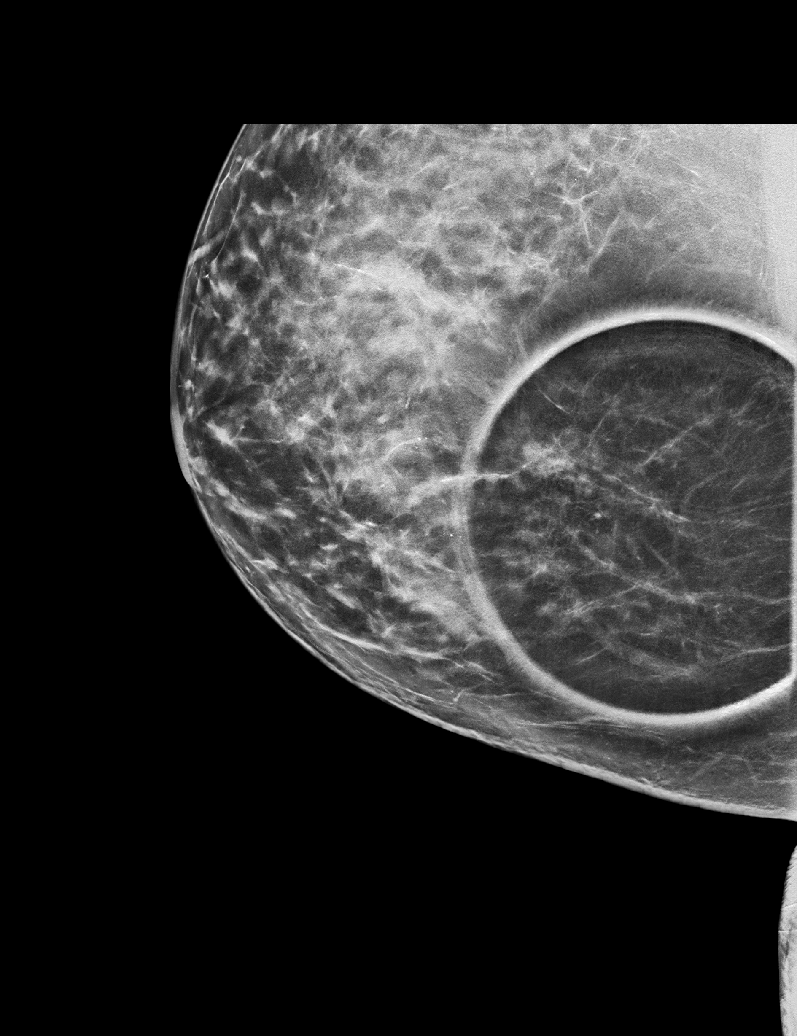

[R CC synth-2D (2 of 2)]
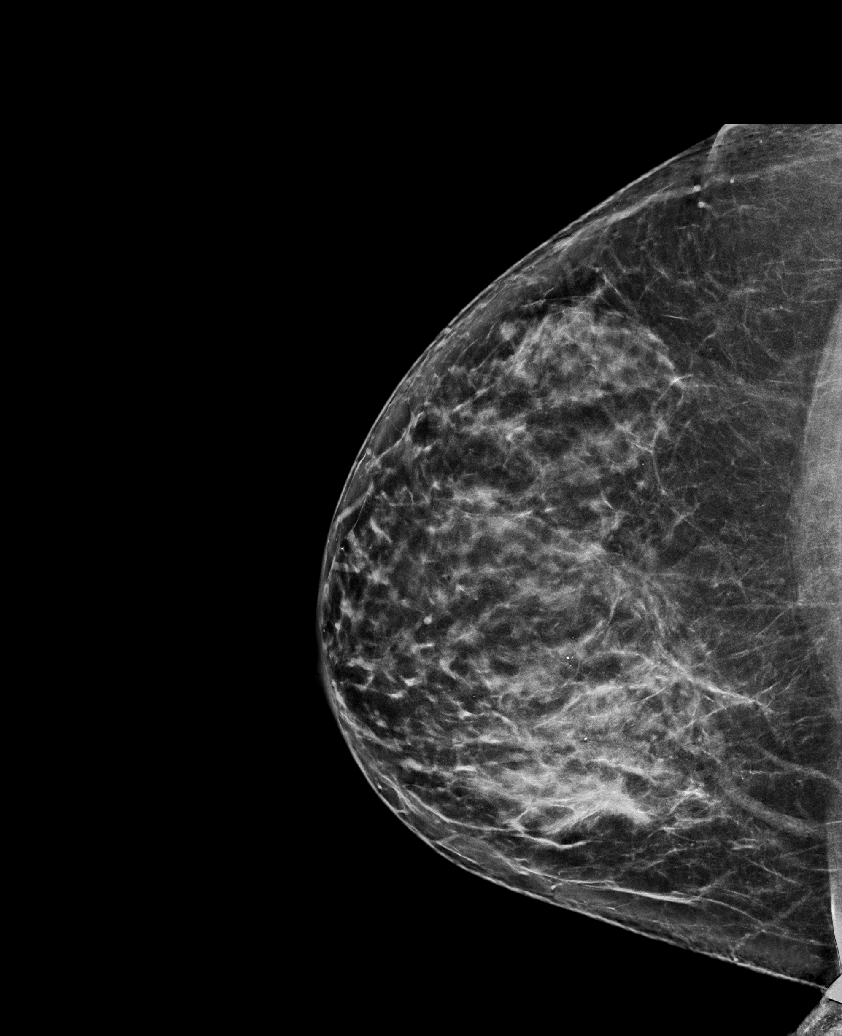

[R CC tomo (1 of 2) · tomo slice 42/83.0]
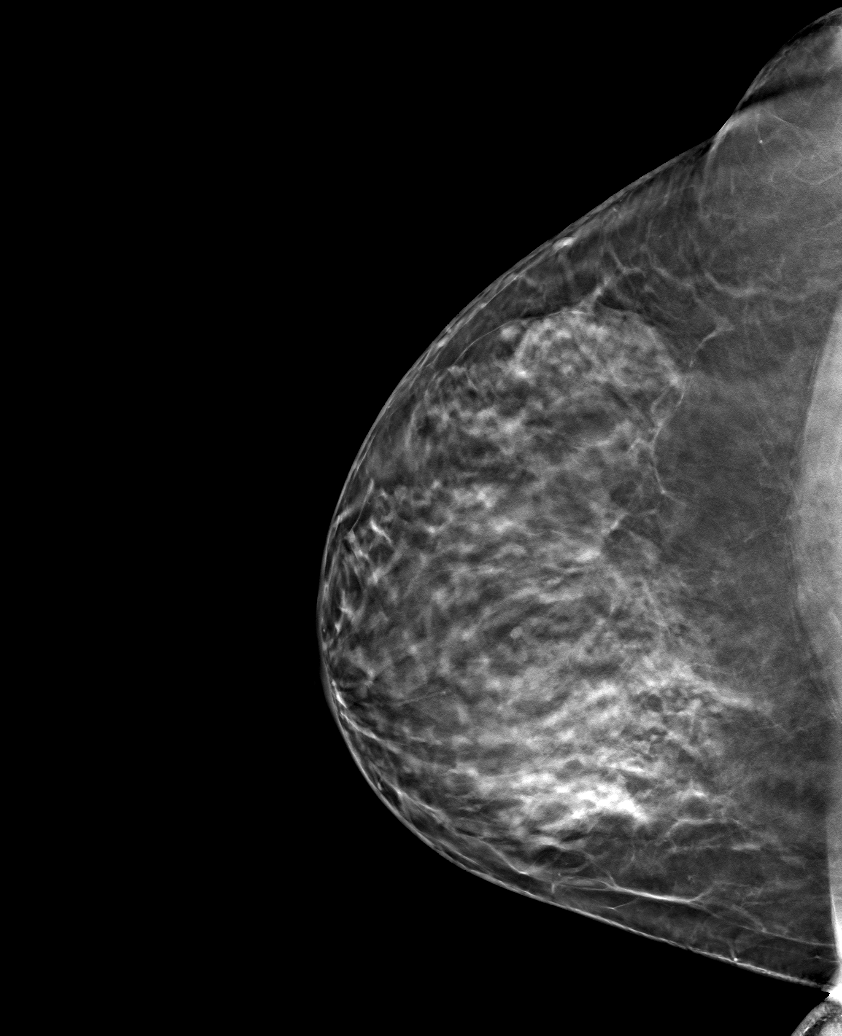

[R MLO tomo · tomo slice 39/78.0]
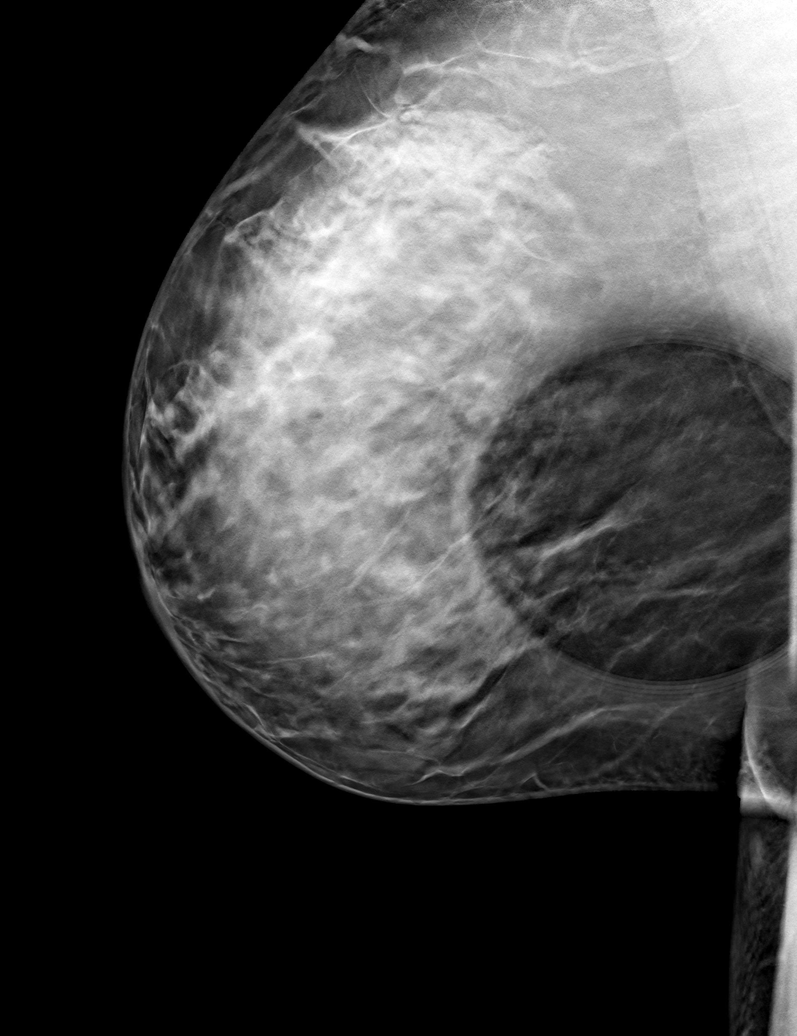

[R CC tomo (2 of 2) · tomo slice 39/78.0]
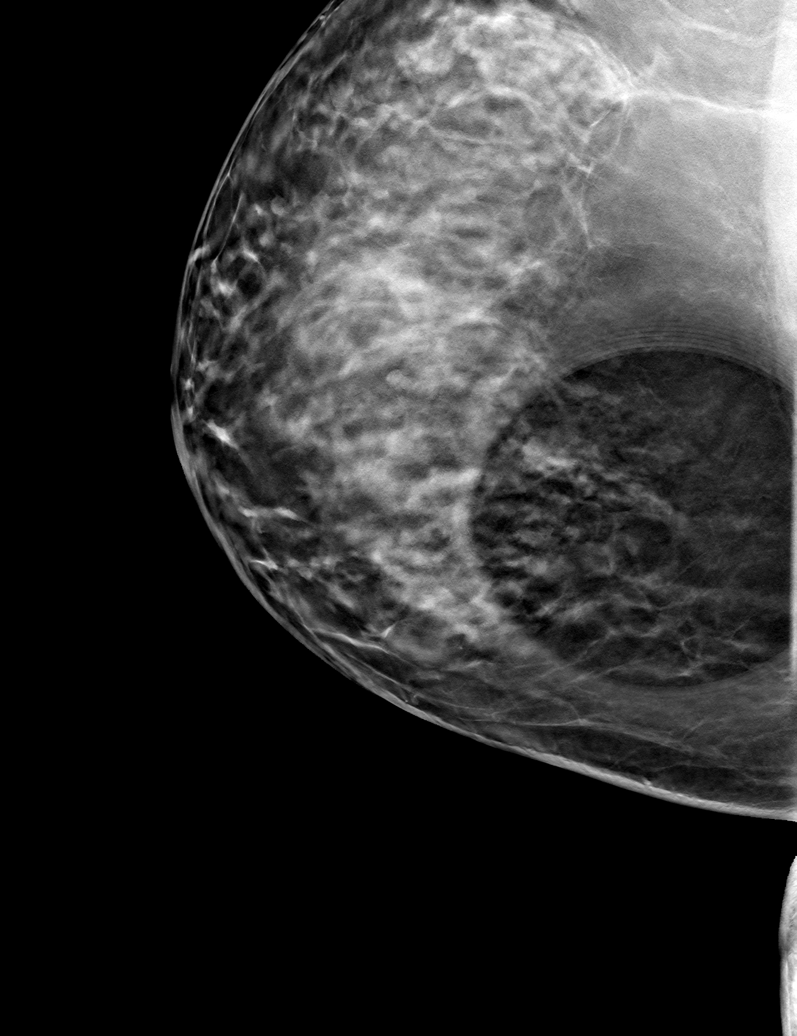

[6 of 18 positions shown; findings below may reference images not displayed]

ACR Breast Density Category c: The breast tissue is heterogeneously
dense, which may obscure small masses.
FINDINGS: The asymmetry in the medial posterior right breast resolves with
additional spot compression tomosynthesis imaging. The full paddle
CC image was repeated, in which the asymmetry was most prominent on
the screening mammogram, showing resolution of the asymmetry.

Mammographic images were processed with CAD.
IMPRESSION: The right breast asymmetry resolves, consistent with overlapping
fibroglandular tissue.

RECOMMENDATION:
Screening mammogram in one year.(Code:KC-[id number])

I have discussed the findings and recommendations with the patient.
If applicable, a reminder letter will be sent to the patient
regarding the next appointment.

BI-RADS CATEGORY  1: Negative.

## 2021-11-20 ENCOUNTER — Telehealth: Payer: Self-pay | Admitting: Family Medicine

## 2021-11-20 DIAGNOSIS — J301 Allergic rhinitis due to pollen: Secondary | ICD-10-CM

## 2021-11-20 DIAGNOSIS — I10 Essential (primary) hypertension: Secondary | ICD-10-CM

## 2021-11-20 NOTE — Telephone Encounter (Signed)
Please advise on pt's request for TOC and refills. Thank you!

## 2021-11-20 NOTE — Telephone Encounter (Signed)
Patient called to request TOC; former patient of [place] who hasn't had time to drop off patient information packet due to work schedule. Original request made a couple of months ago; patient told to fill it out.   Patient will need meds refilled soon; requesting courtesy refills to avoid lapse in doses for the following meds:   cetirizine (ZYRTEC) 10 MG tablet [[id number]] esomeprazole (NEXIUM) 40 MG capsule [[id number]]  fluticasone (FLONASE) 50 MCG/ACT nasal spray [[id number]]  hydrochlorothiazide (HYDRODIURIL) 25 MG tablet [[id number]]   Patient also dropping off new patient packet today.  Please advise at [phone]

## 2021-11-21 MED ORDER — FLUTICASONE PROPIONATE 50 MCG/ACT NA SUSP: 2.0000 | Freq: Every day | NASAL | 0 refills | Status: AC

## 2021-11-21 MED ORDER — ESOMEPRAZOLE MAGNESIUM 40 MG PO CPDR: 40.0000 mg | DELAYED_RELEASE_CAPSULE | Freq: Every day | ORAL | 0 refills | Status: AC

## 2021-11-21 MED ORDER — CETIRIZINE HCL 10 MG PO TABS: 10.0000 mg | ORAL_TABLET | Freq: Every day | ORAL | 0 refills | Status: DC

## 2021-11-21 MED ORDER — HYDROCHLOROTHIAZIDE 25 MG PO TABS: 25.0000 mg | ORAL_TABLET | Freq: Every day | ORAL | 0 refills | Status: AC

## 2021-11-21 NOTE — Telephone Encounter (Signed)
Pt advised. 90 day supply sent in on all meds. Pt advised to use [facility] website to help locate new PCP. Nothing further needed.

## 2022-01-10 ENCOUNTER — Other Ambulatory Visit: Payer: Self-pay | Admitting: Family Medicine

## 2022-01-10 DIAGNOSIS — Z1231 Encounter for screening mammogram for malignant neoplasm of breast: Secondary | ICD-10-CM

## 2022-01-15 DIAGNOSIS — Z0000 Encounter for general adult medical examination without abnormal findings: Secondary | ICD-10-CM | POA: Diagnosis not present

## 2022-01-15 DIAGNOSIS — R946 Abnormal results of thyroid function studies: Secondary | ICD-10-CM | POA: Diagnosis not present

## 2022-01-15 DIAGNOSIS — I10 Essential (primary) hypertension: Secondary | ICD-10-CM | POA: Diagnosis not present

## 2022-01-15 DIAGNOSIS — R7309 Other abnormal glucose: Secondary | ICD-10-CM | POA: Diagnosis not present

## 2022-01-26 ENCOUNTER — Ambulatory Visit
Admission: RE | Admit: 2022-01-26 | Discharge: 2022-01-26 | Disposition: A | Discharge status: Home / Self Care | Payer: BC Managed Care – PPO | Source: Ambulatory Visit | Attending: Family Medicine | Admitting: Family Medicine

## 2022-01-26 DIAGNOSIS — Z1231 Encounter for screening mammogram for malignant neoplasm of breast: Secondary | ICD-10-CM

## 2022-01-29 ENCOUNTER — Other Ambulatory Visit: Payer: Self-pay | Admitting: Family Medicine

## 2022-01-29 DIAGNOSIS — R928 Other abnormal and inconclusive findings on diagnostic imaging of breast: Secondary | ICD-10-CM

## 2022-02-05 ENCOUNTER — Other Ambulatory Visit: Payer: Self-pay

## 2022-02-05 ENCOUNTER — Ambulatory Visit: Payer: BC Managed Care – PPO

## 2022-02-05 ENCOUNTER — Ambulatory Visit
Admission: RE | Admit: 2022-02-05 | Discharge: 2022-02-05 | Disposition: A | Discharge status: Home / Self Care | Payer: BC Managed Care – PPO | Source: Ambulatory Visit | Attending: Family Medicine | Admitting: Family Medicine

## 2022-02-05 DIAGNOSIS — R928 Other abnormal and inconclusive findings on diagnostic imaging of breast: Secondary | ICD-10-CM

## 2022-02-05 DIAGNOSIS — R922 Inconclusive mammogram: Secondary | ICD-10-CM | POA: Diagnosis not present

## 2022-02-27 DIAGNOSIS — E559 Vitamin D deficiency, unspecified: Secondary | ICD-10-CM | POA: Diagnosis not present

## 2022-02-27 DIAGNOSIS — R7309 Other abnormal glucose: Secondary | ICD-10-CM | POA: Diagnosis not present

## 2022-02-27 DIAGNOSIS — R946 Abnormal results of thyroid function studies: Secondary | ICD-10-CM | POA: Diagnosis not present

## 2022-02-27 DIAGNOSIS — I10 Essential (primary) hypertension: Secondary | ICD-10-CM | POA: Diagnosis not present

## 2022-02-27 DIAGNOSIS — Z0000 Encounter for general adult medical examination without abnormal findings: Secondary | ICD-10-CM | POA: Diagnosis not present

## 2022-03-06 DIAGNOSIS — I10 Essential (primary) hypertension: Secondary | ICD-10-CM | POA: Diagnosis not present

## 2022-03-06 DIAGNOSIS — Z0000 Encounter for general adult medical examination without abnormal findings: Secondary | ICD-10-CM | POA: Diagnosis not present

## 2022-12-04 ENCOUNTER — Other Ambulatory Visit (HOSPITAL_COMMUNITY): Payer: Self-pay

## 2022-12-21 ENCOUNTER — Other Ambulatory Visit: Payer: Self-pay | Admitting: Family Medicine

## 2022-12-21 DIAGNOSIS — Z1231 Encounter for screening mammogram for malignant neoplasm of breast: Secondary | ICD-10-CM

## 2023-02-11 ENCOUNTER — Ambulatory Visit
Admission: RE | Admit: 2023-02-11 | Discharge: 2023-02-11 | Disposition: A | Discharge status: Home / Self Care | Payer: BC Managed Care – PPO | Source: Ambulatory Visit | Attending: Family Medicine | Admitting: Family Medicine

## 2023-02-11 DIAGNOSIS — Z1231 Encounter for screening mammogram for malignant neoplasm of breast: Secondary | ICD-10-CM | POA: Diagnosis not present

## 2023-03-12 DIAGNOSIS — R946 Abnormal results of thyroid function studies: Secondary | ICD-10-CM | POA: Diagnosis not present

## 2023-03-12 DIAGNOSIS — R7303 Prediabetes: Secondary | ICD-10-CM | POA: Diagnosis not present

## 2023-03-12 DIAGNOSIS — I10 Essential (primary) hypertension: Secondary | ICD-10-CM | POA: Diagnosis not present

## 2023-03-12 DIAGNOSIS — E559 Vitamin D deficiency, unspecified: Secondary | ICD-10-CM | POA: Diagnosis not present

## 2023-03-12 DIAGNOSIS — Z0000 Encounter for general adult medical examination without abnormal findings: Secondary | ICD-10-CM | POA: Diagnosis not present

## 2023-03-19 DIAGNOSIS — Z0000 Encounter for general adult medical examination without abnormal findings: Secondary | ICD-10-CM | POA: Diagnosis not present

## 2023-03-19 DIAGNOSIS — I10 Essential (primary) hypertension: Secondary | ICD-10-CM | POA: Diagnosis not present

## 2023-03-19 DIAGNOSIS — R8271 Bacteriuria: Secondary | ICD-10-CM | POA: Diagnosis not present

## 2023-11-14 ENCOUNTER — Telehealth: Payer: Self-pay

## 2023-11-14 NOTE — Telephone Encounter (Signed)
Copied from CRM [id number]. Topic: Appointments - Scheduling Inquiry for Clinic >> Nov 14, 2023  1:52 PM Prudencio Pair wrote: Reason for CRM: Patient is trying to find a new PCP and wanted to see if [facility] is accepting new pts. Advised that Dr. Dimas Aguas is currently accepting new pts. She's wanting to see if she will be able to become a pt of Dr. Dimas Aguas. Advised we would send request to see if Dr. Dimas Aguas accepts and then will schedule once we get an "ok." Pt's CB # [phone].

## 2024-02-04 ENCOUNTER — Encounter: Payer: Self-pay | Admitting: Family Medicine

## 2024-02-04 ENCOUNTER — Ambulatory Visit (INDEPENDENT_AMBULATORY_CARE_PROVIDER_SITE_OTHER): Payer: BC Managed Care – PPO | Admitting: Family Medicine

## 2024-02-04 VITALS — BP 134/90 | HR 78 | Temp 98.2°F | Ht 68.0 in | Wt 262.0 lb

## 2024-02-04 DIAGNOSIS — Z6839 Body mass index (BMI) 39.0-39.9, adult: Secondary | ICD-10-CM | POA: Insufficient documentation

## 2024-02-04 DIAGNOSIS — R7303 Prediabetes: Secondary | ICD-10-CM

## 2024-02-04 DIAGNOSIS — K219 Gastro-esophageal reflux disease without esophagitis: Secondary | ICD-10-CM | POA: Diagnosis not present

## 2024-02-04 DIAGNOSIS — Z23 Encounter for immunization: Secondary | ICD-10-CM | POA: Diagnosis not present

## 2024-02-04 DIAGNOSIS — I10 Essential (primary) hypertension: Secondary | ICD-10-CM | POA: Diagnosis not present

## 2024-02-04 DIAGNOSIS — Z1231 Encounter for screening mammogram for malignant neoplasm of breast: Secondary | ICD-10-CM

## 2024-02-04 NOTE — Assessment & Plan Note (Signed)
BP 134/90 today in office. Does not monitor at home. Will monitor at home in AM and report readings to office. Continue Amlodipine 5mg  and hydrochlorothiazide 25mg  daily. Recommend heart healthy diet such as Mediterranean diet with whole grains, fruits, vegetable, fish, lean meats, nuts, and olive oil. Limit salt. Encouraged moderate walking, 3-5 times/week for 30-50 minutes each session. Aim for at least 150 minutes.week. Goal should be pace of 3 miles/hours, or walking 1.5 miles in 30 minutes. Avoid tobacco products. Avoid excess alcohol. Take medications as prescribed and bring medications and blood pressure log with cuff to each office visit. Seek medical care for chest pain, palpitations, shortness of breath with exertion, dizziness/lightheadedness, vision changes, recurrent headaches, or swelling of extremities. Follow up asap for physical with labs 1 week prior.

## 2024-02-04 NOTE — Assessment & Plan Note (Signed)
A1c ordered.

## 2024-02-04 NOTE — Progress Notes (Signed)
New Patient Office Visit  Subjective    Patient ID: Sara Hickman, female    DOB: Feb 18, 1971  Age: 53 y.o. MRN: [id number]  CC:  Chief Complaint  Patient presents with   Establish Care    HPI Sara Hickman presents to establish care. Oriented to practice routines and expectations. Has been seeing another PMH includes GERD, HTN, uterine fibroids. She does see GYN. No current concerns. Last physical with labs March 2024.  Breast CA screening: Mammogram status: Completed 02/01/2023. Repeat every year Cervical CA screening:  hysterectomy 2014 Colon CA screening: colonoscopy 4 years ago without abnormalities. Follow up 5-7 years. Tobacco: non-smoker STI: declines Vaccines:  flu today, UTD Tdap and shingles  HYPERTENSION without Chronic Kidney Disease Hypertension status: stable  Satisfied with current treatment? no Duration of hypertension: years BP monitoring frequency:  not checking BP range:  BP medication side effects:  no Medication compliance: excellent compliance Previous BP meds:Amlodipine and HCTZ Aspirin: no Recurrent headaches: no Visual changes: no Palpitations: no Dyspnea: no Chest pain: no Lower extremity edema: no Dizzy/lightheaded: no    Outpatient Encounter Medications as of 02/04/2024  Medication Sig   amLODipine (NORVASC) 5 MG tablet Take 1 tablet (5 mg total) by mouth daily.   fish oil-omega-3 fatty acids 1000 MG capsule Take 1 g by mouth daily.    hydrochlorothiazide (HYDRODIURIL) 25 MG tablet Take 1 tablet (25 mg total) by mouth daily.   Multiple Vitamin (MULTIVITAMIN ADULT PO) multivitamin   cetirizine (ZYRTEC) 10 MG tablet Take 1 tablet (10 mg total) by mouth daily. (Patient not taking: Reported on 02/04/2024)   Cholecalciferol (VITAMIN D3) 25 MCG (1000 UT) CAPS Vitamin D3 (Patient not taking: Reported on 02/04/2024)   Cyanocobalamin (VITAMIN B 12 PO) B12 (Patient not taking: Reported on 02/04/2024)   esomeprazole (NEXIUM) 40 MG  capsule Take 1 capsule (40 mg total) by mouth daily. (Patient not taking: Reported on 02/04/2024)   fluticasone (FLONASE) 50 MCG/ACT nasal spray Place 2 sprays into both nostrils daily. (Patient not taking: Reported on 02/04/2024)   topiramate (TOPAMAX) 25 MG tablet Take 25 mg by mouth daily. (Patient not taking: Reported on 02/04/2024)   No facility-administered encounter medications on file as of 02/04/2024.    Past Medical History:  Diagnosis Date   Fibroids    GERD (gastroesophageal reflux disease)    SVD (spontaneous vaginal delivery)    x 1    Past Surgical History:  Procedure Laterality Date   ABDOMINAL HYSTERECTOMY N/A 12/03/2013   Procedure: HYSTERECTOMY ABDOMINAL;  Surgeon: Esmeralda Arthur, MD;  Location: [facility];  Service: Gynecology;  Laterality: N/A;   BILATERAL SALPINGECTOMY Bilateral 12/03/2013   Procedure: BILATERAL SALPINGECTOMY;  Surgeon: Esmeralda Arthur, MD;  Location: [facility];  Service: Gynecology;  Laterality: Bilateral;    Family History  Problem Relation Age of Onset   Hypertension Paternal Grandmother    Diabetes Paternal Grandmother    Ovarian cancer Maternal Grandmother 39   Hypertension Mother    Colon cancer Paternal Aunt 8   Hypertension Paternal Aunt    Breast cancer Paternal Aunt 82   Cancer Paternal Aunt    Liver disease Father    Hypertension Paternal Uncle    Diabetes Paternal Uncle    Stroke Maternal Uncle     Social History   Socioeconomic History   Marital status: Married    Spouse name: Not on file   Number of children: Not on file   Years of education: Not on file  Highest education level: Not on file  Occupational History   Not on file  Tobacco Use   Smoking status: Never   Smokeless tobacco: Never  Vaping Use   Vaping status: Never Used  Substance and Sexual Activity   Alcohol use: Yes    Comment: occasional - wine once every 3 months   Drug use: No   Sexual activity: Yes    Birth control/protection: Surgical    Comment:  s/p hysterectomy  Other Topics Concern   Not on file  Social History Narrative   Not on file   Social Drivers of Health   Financial Resource Strain: Not on file  Food Insecurity: No Food Insecurity (05/01/2021)   Received from [facility], [facility]   Hunger Vital Sign    Worried About Running Out of Food in the Last Year: Never true    Ran Out of Food in the Last Year: Never true  Transportation Needs: Not on file  Physical Activity: Insufficiently Active (06/19/2018)   Exercise Vital Sign    Days of Exercise per Week: 5 days    Minutes of Exercise per Session: 20 min  Stress: Not on file  Social Connections: Unknown (05/08/2022)   Received from [facility], [facility]   Social Network    Social Network: Not on file  Intimate Partner Violence: Unknown (03/30/2022)   Received from [facility], [facility]   HITS    Physically Hurt: Not on file    Insult or Talk Down To: Not on file    Threaten Physical Harm: Not on file    Scream or Curse: Not on file    Review of Systems  All other systems reviewed and are negative.       Objective    BP (!) 134/90   Pulse 78   Temp 98.2 F (36.8 C) (Oral)   Ht 5\' 8"  (1.727 m)   Wt 262 lb (118.8 kg)   LMP 09/24/2012   SpO2 100%   BMI 39.84 kg/m     02/04/2024    2:10 PM 02/04/2024    2:07 PM 02/07/2021    8:10 AM  Vitals with BMI  Height  5\' 8"  5\' 8"   Weight  262 lbs 254 lbs  BMI  39.85 38.63  Systolic 134 138 469  Diastolic 90 98 78  Pulse  78 80      Physical Exam Vitals and nursing note reviewed.  Constitutional:      Appearance: Normal appearance. She is obese.  HENT:     Head: Normocephalic and atraumatic.  Cardiovascular:     Rate and Rhythm: Normal rate and regular rhythm.     Pulses: Normal pulses.     Heart sounds: Normal heart sounds.  Pulmonary:     Effort: Pulmonary effort is normal.     Breath sounds: Normal breath sounds.  Skin:    General: Skin is warm and dry.  Neurological:      General: No focal deficit present.     Mental Status: She is alert and oriented to person, place, and time. Mental status is at baseline.  Psychiatric:        Mood and Affect: Mood normal.        Behavior: Behavior normal.        Thought Content: Thought content normal.        Judgment: Judgment normal.         Assessment & Plan:   Problem List Items Addressed This  Visit     Essential hypertension, benign - Primary   BP 134/90 today in office. Does not monitor at home. Will monitor at home in AM and report readings to office. Continue Amlodipine 5mg  and hydrochlorothiazide 25mg  daily. Recommend heart healthy diet such as Mediterranean diet with whole grains, fruits, vegetable, fish, lean meats, nuts, and olive oil. Limit salt. Encouraged moderate walking, 3-5 times/week for 30-50 minutes each session. Aim for at least 150 minutes.week. Goal should be pace of 3 miles/hours, or walking 1.5 miles in 30 minutes. Avoid tobacco products. Avoid excess alcohol. Take medications as prescribed and bring medications and blood pressure log with cuff to each office visit. Seek medical care for chest pain, palpitations, shortness of breath with exertion, dizziness/lightheadedness, vision changes, recurrent headaches, or swelling of extremities. Follow up asap for physical with labs 1 week prior.      Relevant Orders   CBC with Differential/Platelet   COMPLETE METABOLIC PANEL WITH GFR   Lipid panel   Hemoglobin A1c   GERD (gastroesophageal reflux disease)   Well controlled and unmedicated      Pre-diabetes   A1c ordered.       Relevant Orders   CBC with Differential/Platelet   COMPLETE METABOLIC PANEL WITH GFR   Lipid panel   Hemoglobin A1c   Morbid obesity (HCC)   Recommend low calorie, heart healthy diet and moderate intensity exercise 150 minutes weekly. This is 3-5 times weekly for 30-50 minutes each session. Goal should be pace of 3 miles/hours, or walking 1.5 miles in 30 minutes and  include strength training. Will discuss management at upcoming physical. Fasting labs ordered.      Relevant Orders   CBC with Differential/Platelet   COMPLETE METABOLIC PANEL WITH GFR   Lipid panel   Hemoglobin A1c   BMI 39.0-39.9,adult   Other Visit Diagnoses       Encounter for screening mammogram for malignant neoplasm of breast       Relevant Orders   MM DIGITAL SCREENING BILATERAL       Return in about 6 weeks (around 03/17/2024) for annual physical with labs 1 week prior.   Park Meo, FNP

## 2024-02-04 NOTE — Assessment & Plan Note (Signed)
Recommend low calorie, heart healthy diet and moderate intensity exercise 150 minutes weekly. This is 3-5 times weekly for 30-50 minutes each session. Goal should be pace of 3 miles/hours, or walking 1.5 miles in 30 minutes and include strength training. Will discuss management at upcoming physical. Fasting labs ordered.

## 2024-02-04 NOTE — Addendum Note (Signed)
Addended by: Arta Silence on: 02/04/2024 03:22 PM   Modules accepted: Orders

## 2024-02-04 NOTE — Assessment & Plan Note (Signed)
Well controlled and unmedicated

## 2024-02-04 NOTE — Patient Instructions (Signed)
It was great to meet you today and I'm excited to have you join the [facility] practice. I hope you had a positive experience today! If you feel so inclined, please feel free to recommend our practice to friends and family. Kurtis Bushman, FNP-C

## 2024-02-18 ENCOUNTER — Ambulatory Visit
Admission: RE | Admit: 2024-02-18 | Discharge: 2024-02-18 | Disposition: A | Discharge status: Home / Self Care | Payer: BC Managed Care – PPO | Source: Ambulatory Visit | Attending: Family Medicine | Admitting: Family Medicine

## 2024-02-18 DIAGNOSIS — Z1231 Encounter for screening mammogram for malignant neoplasm of breast: Secondary | ICD-10-CM | POA: Diagnosis not present

## 2024-03-11 LAB — LAB REPORT - SCANNED
EGFR (African American): 87
TSH: 2.04 (ref 0.41–5.90)

## 2024-03-13 ENCOUNTER — Other Ambulatory Visit: Payer: BC Managed Care – PPO

## 2024-03-13 DIAGNOSIS — I10 Essential (primary) hypertension: Secondary | ICD-10-CM | POA: Diagnosis not present

## 2024-03-13 DIAGNOSIS — R7303 Prediabetes: Secondary | ICD-10-CM

## 2024-03-14 LAB — CBC WITH DIFFERENTIAL/PLATELET
Absolute Lymphocytes: 2332 {cells}/uL (ref 850–3900)
Absolute Monocytes: 442 {cells}/uL (ref 200–950)
Basophils Absolute: 62 {cells}/uL (ref 0–200)
Basophils Relative: 0.9 %
Eosinophils Absolute: 173 {cells}/uL (ref 15–500)
Eosinophils Relative: 2.5 %
HCT: 41.9 % (ref 35.0–45.0)
Hemoglobin: 13.7 g/dL (ref 11.7–15.5)
MCH: 27.7 pg (ref 27.0–33.0)
MCHC: 32.7 g/dL (ref 32.0–36.0)
MCV: 84.6 fL (ref 80.0–100.0)
MPV: 9.9 fL (ref 7.5–12.5)
Monocytes Relative: 6.4 %
Neutro Abs: 3892 {cells}/uL (ref 1500–7800)
Neutrophils Relative %: 56.4 %
Platelets: 273 10*3/uL (ref 140–400)
RBC: 4.95 10*6/uL (ref 3.80–5.10)
RDW: 12.7 % (ref 11.0–15.0)
Total Lymphocyte: 33.8 %
WBC: 6.9 10*3/uL (ref 3.8–10.8)

## 2024-03-14 LAB — COMPLETE METABOLIC PANEL WITH GFR
AG Ratio: 1.6 (calc) (ref 1.0–2.5)
ALT: 23 U/L (ref 6–29)
AST: 20 U/L (ref 10–35)
Albumin: 4.1 g/dL (ref 3.6–5.1)
Alkaline phosphatase (APISO): 99 U/L (ref 37–153)
BUN: 15 mg/dL (ref 7–25)
CO2: 25 mmol/L (ref 20–32)
Calcium: 9.1 mg/dL (ref 8.6–10.4)
Chloride: 106 mmol/L (ref 98–110)
Creat: 0.81 mg/dL (ref 0.50–1.03)
Globulin: 2.5 g/dL (ref 1.9–3.7)
Glucose, Bld: 79 mg/dL (ref 65–99)
Potassium: 3.7 mmol/L (ref 3.5–5.3)
Sodium: 142 mmol/L (ref 135–146)
Total Bilirubin: 0.4 mg/dL (ref 0.2–1.2)
Total Protein: 6.6 g/dL (ref 6.1–8.1)

## 2024-03-14 LAB — LIPID PANEL
Cholesterol: 171 mg/dL (ref <200)
HDL: 55 mg/dL (ref >=50)
LDL Cholesterol (Calc): 102 mg/dL — ABNORMAL HIGH
Non-HDL Cholesterol (Calc): 116 mg/dL (ref <130)
Total CHOL/HDL Ratio: 3.1 (calc) (ref <5.0)
Triglycerides: 62 mg/dL (ref <150)

## 2024-03-14 LAB — HEMOGLOBIN A1C
Hgb A1c MFr Bld: 5.8 %{Hb} — ABNORMAL HIGH (ref <5.7)
Mean Plasma Glucose: 120 mg/dL
eAG (mmol/L): 6.6 mmol/L

## 2024-03-16 ENCOUNTER — Encounter: Payer: Self-pay | Admitting: Family Medicine

## 2024-03-16 ENCOUNTER — Ambulatory Visit (INDEPENDENT_AMBULATORY_CARE_PROVIDER_SITE_OTHER): Payer: BC Managed Care – PPO | Admitting: Family Medicine

## 2024-03-16 VITALS — BP 132/88 | HR 82 | Temp 97.2°F | Ht 68.0 in | Wt 267.0 lb

## 2024-03-16 DIAGNOSIS — Z6841 Body Mass Index (BMI) 40.0 and over, adult: Secondary | ICD-10-CM

## 2024-03-16 DIAGNOSIS — I10 Essential (primary) hypertension: Secondary | ICD-10-CM | POA: Diagnosis not present

## 2024-03-16 DIAGNOSIS — R7303 Prediabetes: Secondary | ICD-10-CM | POA: Diagnosis not present

## 2024-03-16 DIAGNOSIS — Z0001 Encounter for general adult medical examination with abnormal findings: Secondary | ICD-10-CM | POA: Diagnosis not present

## 2024-03-16 DIAGNOSIS — Z0000 Encounter for general adult medical examination without abnormal findings: Secondary | ICD-10-CM | POA: Insufficient documentation

## 2024-03-16 DIAGNOSIS — E782 Mixed hyperlipidemia: Secondary | ICD-10-CM | POA: Insufficient documentation

## 2024-03-16 MED ORDER — AMLODIPINE BESYLATE 10 MG PO TABS: 10.0000 mg | ORAL_TABLET | Freq: Every day | ORAL | 1 refills | Status: DC

## 2024-03-16 NOTE — Progress Notes (Signed)
 Complete physical exam  Patient: Sara Hickman   DOB: 1971-09-16   53 y.o. Female  MRN: [id number]  Subjective:    Chief Complaint  Patient presents with   Annual Exam    Sara Hickman is a 53 y.o. female who presents today for a complete physical exam. She reports consuming a general diet. The patient does not participate in regular exercise at present. She generally feels well. She reports sleeping well. She does not have additional problems to discuss today. Labs did reveal prediabetes with A1c 5.8% and LDL 102.   The 10-year ASCVD risk score (Arnett DK, et al., 2019) is: 4.2%   Values used to calculate the score:     Age: 63 years     Sex: Female     Is Non-Hispanic African American: Yes     Diabetic: No     Tobacco smoker: No     Systolic Blood Pressure: 132 mmHg     Is BP treated: Yes     HDL Cholesterol: 55 mg/dL     Total Cholesterol: 171 mg/dL  HYPERTENSION without Chronic Kidney Disease Hypertension status: uncontrolled  Satisfied with current treatment? yes Duration of hypertension: years BP monitoring frequency:  a few times a month BP range: 138/86, 134/82, 144/88, 146/90, 141/86, 136/85 BP medication side effects:  no Medication compliance: excellent compliance Previous BP meds: amlodipine, hydrochlorothiazide Aspirin: no Recurrent headaches: no Visual changes: no Palpitations: no Dyspnea: no Chest pain: no Lower extremity edema: no Dizzy/lightheaded: no   Most recent fall risk assessment:    03/16/2024    3:03 PM  Fall Risk   Falls in the past year? 0  Number falls in past yr: 0  Injury with Fall? 0     Most recent depression screenings:    03/16/2024    3:03 PM 02/04/2024    3:19 PM  PHQ 2/9 Scores  PHQ - 2 Score 0 0  PHQ- 9 Score 0 0    Vision:Within last year and Dental: No current dental problems and Receives regular dental care  Patient Active Problem List   Diagnosis Date Noted   Physical exam, annual 03/16/2024    Moderate mixed hyperlipidemia not requiring statin therapy 03/16/2024   Morbid obesity (HCC) 02/04/2024   BMI 39.0-39.9,adult 02/04/2024   Pre-diabetes 02/07/2021   Allergic rhinitis 11/09/2015   Essential hypertension, benign 02/07/2014   GERD (gastroesophageal reflux disease) 02/07/2014   Fibroids 12/03/2013   Obesity 11/14/2013   Fibroid uterus 11/14/2013   Past Medical History:  Diagnosis Date   Fibroids    GERD (gastroesophageal reflux disease)    SVD (spontaneous vaginal delivery)    x 1   Past Surgical History:  Procedure Laterality Date   ABDOMINAL HYSTERECTOMY N/A 12/03/2013   Procedure: HYSTERECTOMY ABDOMINAL;  Surgeon: Esmeralda Arthur, MD;  Location: [facility];  Service: Gynecology;  Laterality: N/A;   BILATERAL SALPINGECTOMY Bilateral 12/03/2013   Procedure: BILATERAL SALPINGECTOMY;  Surgeon: Esmeralda Arthur, MD;  Location: [facility];  Service: Gynecology;  Laterality: Bilateral;   Social History   Tobacco Use   Smoking status: Never   Smokeless tobacco: Never  Vaping Use   Vaping status: Never Used  Substance Use Topics   Alcohol use: Yes    Comment: occasional - wine once every 3 months   Drug use: No   Family History  Problem Relation Age of Onset   Hypertension Paternal Grandmother    Diabetes Paternal Grandmother    Ovarian cancer  Maternal Grandmother 57   Hypertension Mother    Colon cancer Paternal Aunt 64   Hypertension Paternal Aunt    Breast cancer Paternal Aunt 73   Cancer Paternal Aunt    Liver disease Father    Hypertension Paternal Uncle    Diabetes Paternal Uncle    Stroke Maternal Uncle    No Known Allergies    Patient Care Team: Park Meo, FNP as PCP - General (Family Medicine)   Outpatient Medications Prior to Visit  Medication Sig   cetirizine (ZYRTEC) 10 MG tablet Take 1 tablet (10 mg total) by mouth daily.   Cholecalciferol (VITAMIN D3) 25 MCG (1000 UT) CAPS    Cyanocobalamin (VITAMIN B 12 PO)    fluticasone (FLONASE)  50 MCG/ACT nasal spray Place 2 sprays into both nostrils daily.   hydrochlorothiazide (HYDRODIURIL) 25 MG tablet Take 1 tablet (25 mg total) by mouth daily.   Multiple Vitamin (MULTIVITAMIN ADULT PO) multivitamin   topiramate (TOPAMAX) 25 MG tablet Take 25 mg by mouth daily.   [DISCONTINUED] amLODipine (NORVASC) 5 MG tablet Take 1 tablet (5 mg total) by mouth daily.   esomeprazole (NEXIUM) 40 MG capsule Take 1 capsule (40 mg total) by mouth daily. (Patient not taking: Reported on 03/16/2024)   fish oil-omega-3 fatty acids 1000 MG capsule Take 1 g by mouth daily.  (Patient not taking: Reported on 03/16/2024)   No facility-administered medications prior to visit.    Review of Systems  Constitutional: Negative.   HENT: Negative.    Eyes: Negative.   Respiratory: Negative.    Cardiovascular: Negative.   Gastrointestinal: Negative.   Genitourinary: Negative.   Musculoskeletal: Negative.   Skin: Negative.   Neurological: Negative.   Endo/Heme/Allergies: Negative.   Psychiatric/Behavioral: Negative.    All other systems reviewed and are negative.         Objective:     BP 132/88   Pulse 82   Temp (!) 97.2 F (36.2 C)   Ht 5\' 8"  (1.727 m)   Wt 267 lb (121.1 kg)   LMP 09/24/2012   SpO2 99%   BMI 40.60 kg/m  BP Readings from Last 3 Encounters:  03/16/24 132/88  02/04/24 (!) 134/90  02/07/21 132/78   Wt Readings from Last 3 Encounters:  03/16/24 267 lb (121.1 kg)  02/04/24 262 lb (118.8 kg)  02/07/21 254 lb (115.2 kg)      Physical Exam Vitals and nursing note reviewed.  Constitutional:      Appearance: Normal appearance. She is normal weight.  HENT:     Head: Normocephalic and atraumatic.     Right Ear: Tympanic membrane, ear canal and external ear normal.     Left Ear: Tympanic membrane, ear canal and external ear normal.     Nose: Nose normal.     Mouth/Throat:     Mouth: Mucous membranes are moist.     Pharynx: Oropharynx is clear.  Eyes:     Extraocular  Movements: Extraocular movements intact.     Conjunctiva/sclera: Conjunctivae normal.     Pupils: Pupils are equal, round, and reactive to light.  Cardiovascular:     Rate and Rhythm: Normal rate and regular rhythm.     Pulses: Normal pulses.     Heart sounds: Normal heart sounds.  Pulmonary:     Effort: Pulmonary effort is normal.     Breath sounds: Normal breath sounds.  Abdominal:     General: Bowel sounds are normal.     Palpations: Abdomen  is soft.  Musculoskeletal:        General: Normal range of motion.     Cervical back: Normal range of motion and neck supple.  Skin:    General: Skin is warm and dry.     Capillary Refill: Capillary refill takes less than 2 seconds.  Neurological:     General: No focal deficit present.     Mental Status: She is alert and oriented to person, place, and time. Mental status is at baseline.  Psychiatric:        Mood and Affect: Mood normal.        Behavior: Behavior normal.        Thought Content: Thought content normal.        Judgment: Judgment normal.      No results found for any visits on 03/16/24. Last CBC Lab Results  Component Value Date   WBC 6.9 03/13/2024   HGB 13.7 03/13/2024   HCT 41.9 03/13/2024   MCV 84.6 03/13/2024   MCH 27.7 03/13/2024   RDW 12.7 03/13/2024   PLT 273 03/13/2024   Last metabolic panel Lab Results  Component Value Date   GLUCOSE 79 03/13/2024   NA 142 03/13/2024   K 3.7 03/13/2024   CL 106 03/13/2024   CO2 25 03/13/2024   BUN 15 03/13/2024   CREATININE 0.81 03/13/2024   GFRNONAA 78 06/19/2018   CALCIUM 9.1 03/13/2024   PROT 6.6 03/13/2024   ALBUMIN 3.9 03/25/2017   BILITOT 0.4 03/13/2024   ALKPHOS 92 03/25/2017   AST 20 03/13/2024   ALT 23 03/13/2024   Last lipids Lab Results  Component Value Date   CHOL 171 03/13/2024   HDL 55 03/13/2024   LDLCALC 102 (H) 03/13/2024   TRIG 62 03/13/2024   CHOLHDL 3.1 03/13/2024   Last hemoglobin A1c Lab Results  Component Value Date   HGBA1C  5.8 (H) 03/13/2024   Last thyroid functions Lab Results  Component Value Date   TSH 1.17 12/22/2019   Last vitamin D Lab Results  Component Value Date   VD25OH 40 06/19/2018   Last vitamin B12 and Folate Lab Results  Component Value Date   VITAMINB12 1,752 (H) 11/04/2015        Assessment & Plan:    Routine Health Maintenance and Physical Exam  Immunization History  Administered Date(s) Administered   Influenza, Seasonal, Injecte, Preservative Fre 02/04/2024   Influenza,inj,Quad PF,6+ Mos 11/11/2013, 11/09/2015, 10/27/2019   Influenza-Unspecified 09/18/2020, 10/05/2021   PFIZER(Purple Top)SARS-COV-2 Vaccination 02/17/2020, 03/16/2020, 09/18/2020   Tdap 11/09/2015   Zoster Recombinant(Shingrix) 02/10/2021, 04/14/2021    Health Maintenance  Topic Date Due   COVID-19 Vaccine (4 - 2024-25 season) 04/01/2024 (Originally 08/25/2023)   DTaP/Tdap/Td (2 - Td or Tdap) 11/08/2025   MAMMOGRAM  02/17/2026   Colonoscopy  01/07/2030   INFLUENZA VACCINE  Completed   Hepatitis C Screening  Completed   HIV Screening  Completed   Zoster Vaccines- Shingrix  Completed   HPV VACCINES  Aged Out    Discussed health benefits of physical activity, and encouraged her to engage in regular exercise appropriate for her age and condition.  Problem List Items Addressed This Visit     Essential hypertension, benign   BP remains >140/90. Increase Amlodipine to 10mg  and continue hydrochlorothiazide 25mg  daily. Recommend heart healthy diet such as Mediterranean diet with whole grains, fruits, vegetable, fish, lean meats, nuts, and olive oil. Limit salt. Encouraged moderate walking, 3-5 times/week for 30-50 minutes each session. Aim for  at least 150 minutes.week. Goal should be pace of 3 miles/hours, or walking 1.5 miles in 30 minutes. Avoid tobacco products. Avoid excess alcohol. Take medications as prescribed and bring medications and blood pressure log with cuff to each office visit. Seek medical care  for chest pain, palpitations, shortness of breath with exertion, dizziness/lightheadedness, vision changes, recurrent headaches, or swelling of extremities.Follow up in 4 weeks.      Relevant Medications   amLODipine (NORVASC) 10 MG tablet   Pre-diabetes   A1c 5.8%.  I recommend consuming a heart healthy diet such as Mediterranean diet or DASH diet with whole grains, fruits, vegetable, fish, lean meats, nuts, and olive oil. Limit sweets and processed foods. I also encourage moderate intensity exercise 150 minutes weekly. This is 3-5 times weekly for 30-50 minutes each session. Goal should be pace of 3 miles/hours, or walking 1.5 miles in 30 minutes.      Morbid obesity (HCC)   Recommend low calorie, heart healthy diet and moderate intensity exercise 150 minutes weekly. This is 3-5 times weekly for 30-50 minutes each session. Goal should be pace of 3 miles/hours, or walking 1.5 miles in 30 minutes and include strength training. Sadly, Ms Stokes physical today was limited due to having just received a phone call regarding her friends passing. Return to office for further discussion and weight management options.      Physical exam, annual - Primary   Today your medical history was reviewed and routine physical exam with labs was performed. Recommend 150 minutes of moderate intensity exercise weekly and consuming a well-balanced diet. Advised to stop smoking if a smoker, avoid smoking if a non-smoker, limit alcohol consumption to 1 drink per day for women and 2 drinks per day for men, and avoid illicit drug use. Counseled in mental health awareness and when to seek medical care. Vaccine maintenance discussed. Appropriate health maintenance items reviewed. Return to office in 1 year for annual physical exam.       Moderate mixed hyperlipidemia not requiring statin therapy   Your labs showed elevated cholesterol. I recommend consuming a heart healthy diet such as Mediterranean diet or DASH diet with  whole grains, fruits, vegetable, fish, lean meats, nuts, and olive oil. Limit sweets and processed foods. I also encourage moderate intensity exercise 150 minutes weekly. This is 3-5 times weekly for 30-50 minutes each session. Goal should be pace of 3 miles/hours, or walking 1.5 miles in 30 minutes. The 10-year ASCVD risk score (Arnett DK, et al., 2019) is: 4.2%       Relevant Medications   amLODipine (NORVASC) 10 MG tablet   Return in about 4 weeks (around 04/13/2024) for hypertension.     Park Meo, FNP

## 2024-03-16 NOTE — Assessment & Plan Note (Signed)
 Your labs showed elevated cholesterol. I recommend consuming a heart healthy diet such as Mediterranean diet or DASH diet with whole grains, fruits, vegetable, fish, lean meats, nuts, and olive oil. Limit sweets and processed foods. I also encourage moderate intensity exercise 150 minutes weekly. This is 3-5 times weekly for 30-50 minutes each session. Goal should be pace of 3 miles/hours, or walking 1.5 miles in 30 minutes. The 10-year ASCVD risk score (Arnett DK, et al., 2019) is: 4.2%

## 2024-03-16 NOTE — Assessment & Plan Note (Signed)
 Recommend low calorie, heart healthy diet and moderate intensity exercise 150 minutes weekly. This is 3-5 times weekly for 30-50 minutes each session. Goal should be pace of 3 miles/hours, or walking 1.5 miles in 30 minutes and include strength training. Sadly, Sara Hickman physical today was limited due to having just received a phone call regarding her friends passing. Return to office for further discussion and weight management options.

## 2024-03-16 NOTE — Assessment & Plan Note (Signed)
 A1c 5.8%  I recommend consuming a heart healthy diet such as Mediterranean diet or DASH diet with whole grains, fruits, vegetable, fish, lean meats, nuts, and olive oil. Limit sweets and processed foods. I also encourage moderate intensity exercise 150 minutes weekly. This is 3-5 times weekly for 30-50 minutes each session. Goal should be pace of 3 miles/hours, or walking 1.5 miles in 30 minutes.

## 2024-03-16 NOTE — Assessment & Plan Note (Signed)

## 2024-03-16 NOTE — Assessment & Plan Note (Signed)
 BP remains >140/90. Increase Amlodipine to 10mg  and continue hydrochlorothiazide 25mg  daily. Recommend heart healthy diet such as Mediterranean diet with whole grains, fruits, vegetable, fish, lean meats, nuts, and olive oil. Limit salt. Encouraged moderate walking, 3-5 times/week for 30-50 minutes each session. Aim for at least 150 minutes.week. Goal should be pace of 3 miles/hours, or walking 1.5 miles in 30 minutes. Avoid tobacco products. Avoid excess alcohol. Take medications as prescribed and bring medications and blood pressure log with cuff to each office visit. Seek medical care for chest pain, palpitations, shortness of breath with exertion, dizziness/lightheadedness, vision changes, recurrent headaches, or swelling of extremities.Follow up in 4 weeks.

## 2024-04-14 ENCOUNTER — Ambulatory Visit: Admitting: Family Medicine

## 2024-04-14 ENCOUNTER — Encounter: Payer: Self-pay | Admitting: Family Medicine

## 2024-04-14 DIAGNOSIS — R0683 Snoring: Secondary | ICD-10-CM

## 2024-04-14 DIAGNOSIS — I10 Essential (primary) hypertension: Secondary | ICD-10-CM | POA: Diagnosis not present

## 2024-04-14 NOTE — Assessment & Plan Note (Signed)
 STOP BANG 5. Referral placed for sleep apnea testing.

## 2024-04-14 NOTE — Progress Notes (Signed)
 Subjective:  HPI: Sara Hickman is a 53 y.o. female presenting on 04/14/2024 for Follow-up (Here for f/u on blood pressure. )   HPI Patient is in today for blood pressure follow up. She did not bring home readings but believes they have been <140/90. Denies side effects of medications or chest pain, palpitations, recurrent headaches, vision changes, lightheadedness, dizziness, dyspnea on exertion, or swelling of extremities. Other concerns include weight loss and sleep apnea testing. She reports she has been unsuccessful with weight loss efforts including diet and exercise. Is interested in medical weight management. Md Stokes also reports she snores and would like sleep apnea testing. STOP BANG 5.     .  Review of Systems  All other systems reviewed and are negative.   Relevant past medical history reviewed and updated as indicated.   Past Medical History:  Diagnosis Date   Fibroids    GERD (gastroesophageal reflux disease)    SVD (spontaneous vaginal delivery)    x 1     Past Surgical History:  Procedure Laterality Date   ABDOMINAL HYSTERECTOMY N/A 12/03/2013   Procedure: HYSTERECTOMY ABDOMINAL;  Surgeon: Mckinley Spells, MD;  Location: [facility];  Service: Gynecology;  Laterality: N/A;   BILATERAL SALPINGECTOMY Bilateral 12/03/2013   Procedure: BILATERAL SALPINGECTOMY;  Surgeon: Mckinley Spells, MD;  Location: [facility];  Service: Gynecology;  Laterality: Bilateral;    Allergies and medications reviewed and updated.   Current Outpatient Medications:    amLODipine  (NORVASC ) 10 MG tablet, Take 1 tablet (10 mg total) by mouth daily., Disp: 90 tablet, Rfl: 1   cetirizine  (ZYRTEC ) 10 MG tablet, Take 1 tablet (10 mg total) by mouth daily., Disp: 90 tablet, Rfl: 0   Cholecalciferol (VITAMIN D3) 25 MCG (1000 UT) CAPS, , Disp: , Rfl:    Cyanocobalamin (VITAMIN B 12 PO), , Disp: , Rfl:    fluticasone  (FLONASE ) 50 MCG/ACT nasal spray, Place 2 sprays into both nostrils daily.,  Disp: 16 g, Rfl: 0   hydrochlorothiazide  (HYDRODIURIL ) 25 MG tablet, Take 1 tablet (25 mg total) by mouth daily., Disp: 90 tablet, Rfl: 0   Multiple Vitamin (MULTIVITAMIN ADULT PO), multivitamin, Disp: , Rfl:    topiramate (TOPAMAX) 25 MG tablet, Take 25 mg by mouth daily., Disp: , Rfl:    esomeprazole  (NEXIUM ) 40 MG capsule, Take 1 capsule (40 mg total) by mouth daily. (Patient not taking: Reported on 02/04/2024), Disp: 90 capsule, Rfl: 0   fish oil-omega-3 fatty acids 1000 MG capsule, Take 1 g by mouth daily.  (Patient not taking: Reported on 03/16/2024), Disp: , Rfl:   No Known Allergies  Objective:   BP 136/84   Pulse 78   Temp 98.6 F (37 C)   Ht 5\' 8"  (1.727 m)   Wt 268 lb 6.4 oz (121.7 kg)   LMP 09/24/2012   SpO2 99%   BMI 40.81 kg/m      04/14/2024    4:06 PM 03/16/2024    3:00 PM 02/04/2024    2:10 PM  Vitals with BMI  Height 5\' 8"  5\' 8"    Weight 268 lbs 6 oz 267 lbs   BMI 40.82 40.61   Systolic 136 132 098  Diastolic 84 88 90  Pulse 78 82      Physical Exam Vitals and nursing note reviewed.  Constitutional:      Appearance: Normal appearance. She is obese.  HENT:     Head: Normocephalic and atraumatic.  Cardiovascular:     Rate and  Rhythm: Normal rate and regular rhythm.     Pulses: Normal pulses.     Heart sounds: Normal heart sounds.  Pulmonary:     Effort: Pulmonary effort is normal.     Breath sounds: Normal breath sounds.  Skin:    General: Skin is warm and dry.  Neurological:     General: No focal deficit present.     Mental Status: She is alert and oriented to person, place, and time. Mental status is at baseline.  Psychiatric:        Mood and Affect: Mood normal.        Behavior: Behavior normal.        Thought Content: Thought content normal.        Judgment: Judgment normal.     Assessment & Plan:  Morbid obesity (HCC) Assessment & Plan: Counseled on importance of weight management for overall health. Encouraged low calorie, heart  healthy diet and moderate intensity exercise 150 minutes weekly. This is 3-5 times weekly for 30-50 minutes each session. Goal should be pace of 3 miles/hours, or walking 1.5 miles in 30 minutes and include strength training. Discussed risks of obesity. Will refer to [facility].   Orders: -     Amb Ref to Medical Weight Management  Snoring Assessment & Plan: STOP BANG 5. Referral placed for sleep apnea testing.   Orders: -     Ambulatory referral to Sleep Studies  Essential hypertension, benign Assessment & Plan: BP at goal <140/90. Continue Amlodipine  10mg  daily and continue hydrochlorothiazide  25mg  daily. Recommend heart healthy diet such as Mediterranean diet with whole grains, fruits, vegetable, fish, lean meats, nuts, and olive oil. Limit salt. Encouraged moderate walking, 3-5 times/week for 30-50 minutes each session. Aim for at least 150 minutes.week. Goal should be pace of 3 miles/hours, or walking 1.5 miles in 30 minutes. Avoid tobacco products. Avoid excess alcohol. Take medications as prescribed and bring medications and blood pressure log with cuff to each office visit. Seek medical care for chest pain, palpitations, shortness of breath with exertion, dizziness/lightheadedness, vision changes, recurrent headaches, or swelling of extremities. Continue to monitor at home and return to office if sustains >140/90. Follow up in 3 months      Follow up plan: Return in about 3 months (around 07/14/2024) for chronic follow-up with labs 1 week prior.  Jenelle Mis, FNP

## 2024-04-14 NOTE — Assessment & Plan Note (Signed)
 Counseled on importance of weight management for overall health. Encouraged low calorie, heart healthy diet and moderate intensity exercise 150 minutes weekly. This is 3-5 times weekly for 30-50 minutes each session. Goal should be pace of 3 miles/hours, or walking 1.5 miles in 30 minutes and include strength training. Discussed risks of obesity. Will refer to [facility].

## 2024-04-14 NOTE — Assessment & Plan Note (Signed)
 BP at goal <140/90. Continue Amlodipine  10mg  daily and continue hydrochlorothiazide  25mg  daily. Recommend heart healthy diet such as Mediterranean diet with whole grains, fruits, vegetable, fish, lean meats, nuts, and olive oil. Limit salt. Encouraged moderate walking, 3-5 times/week for 30-50 minutes each session. Aim for at least 150 minutes.week. Goal should be pace of 3 miles/hours, or walking 1.5 miles in 30 minutes. Avoid tobacco products. Avoid excess alcohol. Take medications as prescribed and bring medications and blood pressure log with cuff to each office visit. Seek medical care for chest pain, palpitations, shortness of breath with exertion, dizziness/lightheadedness, vision changes, recurrent headaches, or swelling of extremities. Continue to monitor at home and return to office if sustains >140/90. Follow up in 3 months

## 2024-04-15 ENCOUNTER — Encounter (INDEPENDENT_AMBULATORY_CARE_PROVIDER_SITE_OTHER): Payer: Self-pay

## 2024-04-22 ENCOUNTER — Telehealth: Payer: Self-pay

## 2024-04-22 NOTE — Telephone Encounter (Signed)
 Copied from CRM [id number]. Topic: Referral - Question >> Apr 22, 2024 10:24 AM Lotus Round B wrote: Reason for CRM: pt called in stating at her last appt she talked to dr.howard to see if she can get a sleep study . She is calling today to see if she needs a referral for that or if one was already put in for her . Pt would like a call to see if she needs to do about getting a referral for the sleep study or she can just call the sleep center and make an appointment .

## 2024-04-22 NOTE — Telephone Encounter (Signed)
 Copied from CRM [id number]. Topic: Referral - Question >> Apr 22, 2024 12:16 PM Carlatta H wrote: Reason for CRM: Patient would like to know if sleep study referral [facility]//

## 2024-05-04 ENCOUNTER — Telehealth: Payer: Self-pay

## 2024-05-04 ENCOUNTER — Institutional Professional Consult (permissible substitution): Admitting: Neurology

## 2024-05-04 NOTE — Telephone Encounter (Signed)
 Copied from CRM [id number]. Topic: Referral - Question >> Apr 22, 2024 12:16 PM Carlatta H wrote: Reason for CRM: Patient would like to know if sleep study referral [facility]// >> May 04, 2024 12:39 PM Emylou G wrote: Patient called.. said the sleep study was cancelled.. now not available til end of month?  She wants to be seen sooner.. is there alternative.. number on file is good.

## 2024-05-05 ENCOUNTER — Encounter: Payer: Self-pay | Admitting: Neurology

## 2024-05-05 ENCOUNTER — Ambulatory Visit (INDEPENDENT_AMBULATORY_CARE_PROVIDER_SITE_OTHER): Admitting: Neurology

## 2024-05-05 VITALS — BP 125/83 | HR 78 | Ht 68.0 in | Wt 267.2 lb

## 2024-05-05 DIAGNOSIS — R0683 Snoring: Secondary | ICD-10-CM | POA: Diagnosis not present

## 2024-05-05 DIAGNOSIS — G4719 Other hypersomnia: Secondary | ICD-10-CM | POA: Diagnosis not present

## 2024-05-05 DIAGNOSIS — R0681 Apnea, not elsewhere classified: Secondary | ICD-10-CM

## 2024-05-05 DIAGNOSIS — Z9189 Other specified personal risk factors, not elsewhere classified: Secondary | ICD-10-CM

## 2024-05-05 DIAGNOSIS — Z820 Family history of epilepsy and other diseases of the nervous system: Secondary | ICD-10-CM

## 2024-05-05 DIAGNOSIS — R351 Nocturia: Secondary | ICD-10-CM

## 2024-05-05 NOTE — Patient Instructions (Signed)

## 2024-05-05 NOTE — Progress Notes (Signed)
 Subjective:    Patient ID: Sara Hickman is a 53 y.o. female.  HPI    Sara Fairy, MD, PhD [facility] [address] [address] [place], [place] [address]  Dear Sara Hickman,  I saw your patient, Sara Hickman, upon your kind request in my sleep clinic today for initial consultation of her sleep disorder, in particular, concern for underlying obstructive sleep apnea.  The patient is unaccompanied today.  As you know, [name] is a 53 year old female with an underlying medical history of hypertension, reflux disease, and severe obesity with a BMI of over 40, who reports snoring and excessive daytime somnolence, as well as witnessed apneas per husband's observation.  Her Epworth sleepiness score is 9 out of 24, fatigue severity score is 38 out of 63.  I reviewed your office note from 04/14/2024.  She does not wake up rested, she has nocturia about 2-3 times per average night.  She makes enough time for sleep but has trouble maintaining sleep.  She goes to bed around 9:30 PM and rise time is around 7 AM.  She lives with her husband and her son.  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  Her sister has sleep apnea and uses a PAP machine.  Patient denies recurrent nocturnal morning headaches.  She does not drink caffeine daily.  She drinks alcohol infrequently, maybe once a month.  She is a non-smoker.  She does have a TV on in her bedroom and it may stay on all night or go up on the timer or may be turned off by one of them.  They do not have any pets in the household.  Her Past Medical History Is Significant For: Past Medical History:  Diagnosis Date   Fibroids    GERD (gastroesophageal reflux disease)    SVD (spontaneous vaginal delivery)    x 1    Her Past Surgical History Is Significant For: Past Surgical History:  Procedure Laterality Date   ABDOMINAL HYSTERECTOMY N/A 12/03/2013   Procedure:  HYSTERECTOMY ABDOMINAL;  Surgeon: Mckinley Spells, MD;  Location: [facility];  Service: Gynecology;  Laterality: N/A;   BILATERAL SALPINGECTOMY Bilateral 12/03/2013   Procedure: BILATERAL SALPINGECTOMY;  Surgeon: Mckinley Spells, MD;  Location: [facility];  Service: Gynecology;  Laterality: Bilateral;    Her Family History Is Significant For: Family History  Problem Relation Age of Onset   Hypertension Paternal Grandmother    Diabetes Paternal Grandmother    Ovarian cancer Maternal Grandmother 25   Hypertension Mother    Colon cancer Paternal Aunt 23   Hypertension Paternal Aunt    Breast cancer Paternal Aunt 31   Cancer Paternal Aunt    Liver disease Father    Hypertension Paternal Uncle    Diabetes Paternal Uncle    Stroke Maternal Uncle     Her Social History Is Significant For: Social History   Socioeconomic History   Marital status: Married    Spouse name: gregory   Number of children: 1   Years of education: Not on file   Highest education level: Some college, no degree  Occupational History   Not on file  Tobacco Use   Smoking status: Never    Passive exposure: Never   Smokeless tobacco: Never  Vaping Use   Vaping status: Never Used  Substance and Sexual Activity   Alcohol use: Yes    Alcohol/week: 1.0 standard drink of alcohol    Types:  1 Standard drinks or equivalent per week    Comment: occasional - wine once every 3 months   Drug use: Not Currently   Sexual activity: Not Currently    Birth control/protection: Surgical    Comment: s/p hysterectomy  Other Topics Concern   Not on file  Social History Narrative   Not on file   Social Drivers of Health   Financial Resource Strain: Low Risk  (03/12/2024)   Overall Financial Resource Strain (CARDIA)    Difficulty of Paying Living Expenses: Not hard at all  Food Insecurity: No Food Insecurity (03/12/2024)   Hunger Vital Sign    Worried About Running Out of Food in the Last Year: Never true    Ran Out of Food in  the Last Year: Never true  Transportation Needs: No Transportation Needs (03/12/2024)   PRAPARE - Administrator, Civil Service (Medical): No    Lack of Transportation (Non-Medical): No  Physical Activity: Unknown (03/12/2024)   Exercise Vital Sign    Days of Exercise per Week: 0 days    Minutes of Exercise per Session: Not on file  Stress: No Stress Concern Present (03/12/2024)   [employer] of Occupational Health - Occupational Stress Questionnaire    Feeling of Stress : Not at all  Social Connections: Socially Integrated (03/12/2024)   Social Connection and Isolation Panel [NHANES]    Frequency of Communication with Friends and Family: More than three times a week    Frequency of Social Gatherings with Friends and Family: Three times a week    Attends Religious Services: More than 4 times per year    Active Member of Clubs or Organizations: Yes    Attends Banker Meetings: More than 4 times per year    Marital Status: Married    Her Allergies Are:  No Known Allergies:   Her Current Medications Are:  Outpatient Encounter Medications as of 05/05/2024  Medication Sig   amLODipine  (NORVASC ) 10 MG tablet Take 1 tablet (10 mg total) by mouth daily.   cetirizine  (ZYRTEC ) 10 MG chewable tablet Chew 10 mg by mouth daily as needed for allergies.   Cholecalciferol (VITAMIN D3) 25 MCG (1000 UT) CAPS    Cyanocobalamin (VITAMIN B 12 PO)    esomeprazole  (NEXIUM ) 40 MG capsule Take 1 capsule (40 mg total) by mouth daily.   fish oil-omega-3 fatty acids 1000 MG capsule Take 1 g by mouth daily.   fluticasone  (FLONASE ) 50 MCG/ACT nasal spray Place 2 sprays into both nostrils daily.   hydrochlorothiazide  (HYDRODIURIL ) 25 MG tablet Take 1 tablet (25 mg total) by mouth daily.   Multiple Vitamin (MULTIVITAMIN ADULT PO) multivitamin   topiramate (TOPAMAX) 25 MG tablet Take 25 mg by mouth daily.   [DISCONTINUED] cetirizine  (ZYRTEC ) 10 MG tablet Take 1 tablet (10 mg total) by  mouth daily.   No facility-administered encounter medications on file as of 05/05/2024.  :   Review of Systems:  Out of a complete 14 point review of systems, all are reviewed and negative with the exception of these symptoms as listed below:  Review of Systems  Neurological:        Pt is here for sleep evaluation. Pt stated that she doesn't feel well rested and wakes frequently during the night and on occasion cannot go back to sleep. Her husband tells her she snores heavily. She wakes up with a sore throat frequently. FSS SCORE OF 38. ESS SCORE OF 9.  Objective:  Neurological Exam  Physical Exam Physical Examination:   Vitals:   05/05/24 0924  BP: 125/83  Pulse: 78  SpO2: 95%    General Examination: The patient is a very pleasant 53 y.o. female in no acute distress. She appears well-developed and well-nourished and well groomed.   HEENT: Normocephalic, atraumatic, pupils are equal, round and reactive to light, extraocular tracking is good without limitation to gaze excursion or nystagmus noted. Hearing is grossly intact. Face is symmetric with normal facial animation. Speech is clear with no dysarthria noted. There is no hypophonia. There is no lip, neck/head, jaw or voice tremor. Neck is supple with full range of passive and active motion. There are no carotid bruits on auscultation. Oropharynx exam reveals: mild mouth dryness, good dental hygiene, moderate airway crowding secondary to thicker soft palate, smaller airway entry, wider tongue, Mallampati class III, neck circumference 15-1/2 inches, mild overbite noted.  Tongue protrudes centrally and palate elevates symmetrically.   Chest: Clear to auscultation without wheezing, rhonchi or crackles noted.  Heart: S1+S2+0, regular and normal without murmurs, rubs or gallops noted.   Abdomen: Soft, non-tender and non-distended.  Extremities: There is trace pitting edema in the distal lower extremities bilaterally.   Skin:  Warm and dry without trophic changes noted.   Musculoskeletal: exam reveals no obvious joint deformities.   Neurologically:  Mental status: The patient is awake, alert and oriented in all 4 spheres. Her immediate and remote memory, attention, language skills and fund of knowledge are appropriate. There is no evidence of aphasia, agnosia, apraxia or anomia. Speech is clear with normal prosody and enunciation. Thought process is linear. Mood is normal and affect is normal.  Cranial nerves II - XII are as described above under HEENT exam.  Motor exam: Normal bulk, moving all 4 extremities without restriction, no obvious action or resting tremor.  Fine motor skills and coordination: grossly intact.  Cerebellar testing: No dysmetria or intention tremor. There is no truncal or gait ataxia.  Sensory exam: intact to light touch in the upper and lower extremities.  Gait, station and balance: She stands easily. No veering to one side is noted. No leaning to one side is noted. Posture is age-appropriate and stance is narrow based. Gait shows normal stride length and normal pace. No problems turning are noted.   Assessment and Plan:   In summary, Saddie R Hickman is a very pleasant 53 year old female with an underlying medical history of hypertension, reflux disease, and severe obesity with a BMI of over 40, whose history and physical exam are concerning for sleep disordered breathing, particularly obstructive sleep apnea (OSA). A laboratory attended sleep study is typically considered "gold standard" for evaluation of sleep disordered breathing.   I had a long chat with the patient about my findings and the diagnosis of sleep apnea, particularly OSA, its prognosis and treatment options. We talked about medical/conservative treatments, surgical interventions and non-pharmacological approaches for symptom control. I explained, in particular, the risks and ramifications of untreated moderate to severe OSA,  especially with respect to developing cardiovascular disease down the road, including congestive heart failure (CHF), difficult to treat hypertension, cardiac arrhythmias (particularly A-fib), neurovascular complications including TIA, stroke and dementia. Even type 2 diabetes has, in part, been linked to untreated OSA. Symptoms of untreated OSA may include (but may not be limited to) daytime sleepiness, nocturia (i.e. frequent nighttime urination), memory problems, mood irritability and suboptimally controlled or worsening mood disorder such as depression and/or anxiety, lack of energy,  lack of motivation, physical discomfort, as well as recurrent headaches, especially morning or nocturnal headaches. We talked about the importance of maintaining a healthy lifestyle and striving for healthy weight. In addition, we talked about the importance of striving for and maintaining good sleep hygiene. I recommended a sleep study at this time. I outlined the differences between a laboratory attended sleep study which is considered more comprehensive and accurate over the option of a home sleep test (HST); the latter may lead to underestimation of sleep disordered breathing in some instances and does not help with diagnosing upper airway resistance syndrome and is not accurate enough to diagnose primary central sleep apnea typically. I outlined possible surgical and non-surgical treatment options of OSA, including the use of a positive airway pressure (PAP) device (i.e. CPAP, AutoPAP/APAP or BiPAP in certain circumstances), a custom-made dental device (aka oral appliance, which would require a referral to a specialist dentist or orthodontist typically, and is generally speaking not considered for patients with full dentures or edentulous state), upper airway surgical options, such as traditional UPPP (which is not considered a first-line treatment) or the Inspire device (hypoglossal nerve stimulator, which would involve a  referral for consultation with an ENT surgeon, after careful selection, following inclusion criteria - also not first-line treatment). I explained the PAP treatment option to the patient in detail, as this is generally considered first-line treatment.  The patient indicated that she would be willing to try PAP therapy, if the need arises. I explained the importance of being compliant with PAP treatment, not only for insurance purposes but primarily to improve patient's symptoms symptoms, and for the patient's long term health benefit, including to reduce Her cardiovascular risks longer-term.    We will pick up our discussion about the next steps and treatment options after testing.  We will keep her posted as to the test results by phone call and/or MyChart messaging where possible.  We will plan to follow-up in sleep clinic accordingly as well.  I answered all her questions today and the patient was in agreement.   I encouraged her to call with any interim questions, concerns, problems or updates or email us  through MyChart.  Generally speaking, sleep test authorizations may take up to 2 weeks, sometimes less, sometimes longer, the patient is encouraged to get in touch with us  if they do not hear back from the sleep lab staff directly within the next 2 weeks.  Thank you very much for allowing me to participate in the care of this nice patient. If I can be of any further assistance to you please do not hesitate to call me at [phone].  Sincerely,   Sara Fairy, MD, PhD

## 2024-05-05 NOTE — Progress Notes (Deleted)
 Pt is here for sleep evaluation. Pt stated that she doesn't feel well rested and wakes frequently during the night and on occasion cannot go back to sleep. Her husband tells her she snores heavily. She wakes up with a sore throat frequently. FSS SCORE OF 38. ESS SCORE OF 9.

## 2024-05-15 ENCOUNTER — Ambulatory Visit (INDEPENDENT_AMBULATORY_CARE_PROVIDER_SITE_OTHER): Admitting: Neurology

## 2024-05-15 DIAGNOSIS — R351 Nocturia: Secondary | ICD-10-CM

## 2024-05-15 DIAGNOSIS — Z9189 Other specified personal risk factors, not elsewhere classified: Secondary | ICD-10-CM

## 2024-05-15 DIAGNOSIS — R0681 Apnea, not elsewhere classified: Secondary | ICD-10-CM

## 2024-05-15 DIAGNOSIS — G4733 Obstructive sleep apnea (adult) (pediatric): Secondary | ICD-10-CM

## 2024-05-15 DIAGNOSIS — G4719 Other hypersomnia: Secondary | ICD-10-CM

## 2024-05-15 DIAGNOSIS — Z820 Family history of epilepsy and other diseases of the nervous system: Secondary | ICD-10-CM

## 2024-05-15 DIAGNOSIS — R0683 Snoring: Secondary | ICD-10-CM

## 2024-05-19 ENCOUNTER — Institutional Professional Consult (permissible substitution): Admitting: Neurology

## 2024-05-21 ENCOUNTER — Institutional Professional Consult (permissible substitution): Admitting: Neurology

## 2024-06-01 ENCOUNTER — Encounter: Payer: Self-pay | Admitting: Family Medicine

## 2024-06-11 NOTE — Progress Notes (Signed)
 See procedure note.

## 2024-06-12 ENCOUNTER — Ambulatory Visit: Payer: Self-pay | Admitting: Neurology

## 2024-06-12 DIAGNOSIS — G4733 Obstructive sleep apnea (adult) (pediatric): Secondary | ICD-10-CM

## 2024-06-12 NOTE — Procedures (Signed)
 [facility]  HOME SLEEP TEST (SANSA) REPORT (Mail-Out Device):   STUDY DATE: 05/19/2024  DOB: August 11, 1971  MRN: [id number]  ORDERING CLINICIAN: [name], MD, PhD   REFERRING CLINICIAN: Jenelle Mis, FNP   CLINICAL INFORMATION/HISTORY: 53 year old female with an underlying medical history of hypertension, reflux disease, and severe obesity with a BMI of over 40, who reports snoring and excessive daytime somnolence, as well as witnessed apneas.  PATIENT'S LAST REPORTED EPWORTH SLEEPINESS SCORE (ESS): 9/24.  BMI (at the time of sleep clinic visit and/or test date): 40.6 kg/m  FINDINGS:   Study Protocol:    The SANSA single-point-of-skin-contact chest-worn sensor - an FDA cleared and DOT approved type 4 home sleep test device - measures eight physiological channels,  including blood oxygen saturation (measured via PPG [photoplethysmography]), EKG-derived heart rate, respiratory effort, chest movement (measured via accelerometer), snoring, body position, and actigraphy. The device is designed to be worn for up to 10 hours per study.   Sleep Summary:   Total Recording Time (hours, min): 9 hours, 42 min  Total Effective Sleep Time (hours, min):  6 hours, 39 min  Sleep Efficiency (%):    76%   Respiratory Indices:   Calculated sAHI (per hour):  20.1/hour         Oxygen Saturation Statistics:    Oxygen Saturation (%) Mean: 93.4%   Minimum oxygen saturation (%):                 80.2%   O2 Saturation Range (%): 80.2-99.9%   Time below or at 88% saturation: 4 min   Pulse Rate Statistics:   Pulse Mean (bpm):    78/min    Pulse Range (65-103/min)   Snoring: Intermittent, mild  IMPRESSION/DIAGNOSES:   OSA (obstructive sleep apnea), moderate   RECOMMENDATIONS:   This home sleep test demonstrates moderate obstructive sleep apnea with a total AHI of 20.1/hour and O2 nadir of 80.2%.  Intermittent, mild snoring was detected. Treatment with a  positive airway pressure (PAP) device is recommended. The patient will be advised to proceed with an autoPAP titration/trial at home for now. A full night titration study may be considered to optimize treatment settings, monitor proper oxygen saturations and aid with improvement of tolerance and adherence, if needed down the road. Alternative treatment options may include a dental device through dentistry or orthodontics in selected patients or Inspire (hypoglossal nerve stimulator) in carefully selected patients (meeting inclusion criteria).  Concomitant weight loss is recommended (where clinically appropriate). Please note that untreated obstructive sleep apnea may carry additional perioperative morbidity. Patients with significant obstructive sleep apnea should receive perioperative PAP therapy and the surgeons and particularly the anesthesiologist should be informed of the diagnosis and the severity of the sleep disordered breathing. The patient should be cautioned not to drive, work at heights, or operate dangerous or heavy equipment when tired or sleepy. Review and reiteration of good sleep hygiene measures should be pursued with any patient. Other causes of the patient's symptoms, including circadian rhythm disturbances, an underlying mood disorder, medication effect and/or an underlying medical problem cannot be ruled out based on this test. Clinical correlation is recommended.  The patient and her referring provider will be notified of the test results. The patient will be seen in follow up in sleep clinic at [facility].  I certify that I have reviewed the raw data recording prior to the issuance of this report in accordance with the standards of the American Academy of Sleep Medicine (AASM).  INTERPRETING PHYSICIAN:   [name], MD, PhD Medical Director, [facility]) Diplomat, ABPN (Neurology and Sleep)   [facility] [address]  [address] [place], [place] [address] [phone]

## 2024-06-17 ENCOUNTER — Institutional Professional Consult (permissible substitution) (INDEPENDENT_AMBULATORY_CARE_PROVIDER_SITE_OTHER): Admitting: Internal Medicine

## 2024-06-22 DIAGNOSIS — A499 Bacterial infection, unspecified: Secondary | ICD-10-CM | POA: Diagnosis not present

## 2024-06-23 ENCOUNTER — Ambulatory Visit: Admitting: Family Medicine

## 2024-06-23 NOTE — Telephone Encounter (Signed)
 Called pt.  Gave her results of Sleep study results.  OSA moderate.  Recommend autopap. DME [facility].  They will authorize machine thru insurance, call you.  Once get machine use 4 hrs or more nightly.  We see back for insurance compliance appt.  Scheduled 09-09-2024 at 0900 VV with AL/NP.  She verbalized understanding.  Community message send to [facility].

## 2024-06-23 NOTE — Telephone Encounter (Signed)
 RE: new autopap user moderate OSA Received: Today Zott, Glade Salt, Nena RAMAN, RN; Lavon Milling; Darrel Boyer Got It Thank you     Previous Messages    ----- Message ----- From: Salt Nena RAMAN, RN Sent: 06/23/2024  11:02 AM EDT To: Milling Donnice Boyer Darrel; Glade Zott Subject: new autopap user moderate OSA                  New order in EPIC  Primary insurance [employer]  (cards in media)  Joletta R. Stokes-Hewitt Female, 53 y.o., [birth year] MRN: [id number] Phone: [phone]  Thanks, [place]

## 2024-06-23 NOTE — Telephone Encounter (Signed)
-----   Message from True Mar sent at 06/12/2024  2:14 PM EDT ----- Patient referred by PCP, seen by me on 05/05/2024, patient had a HST on 05/19/2024.    Please call and notify the patient that the recent home sleep test showed obstructive sleep apnea in the moderate range. I recommend treatment in the form of autoPAP, which means, that we don't have  to bring her in for a sleep study with CPAP, but will let her start using a so called autoPAP machine at home, which is a CPAP-like machine with self-adjusting pressures. We will send the order to a  local DME company (of her choice, or as per insurance requirement). The DME representative will fit her with a mask, educate her on how to use the machine, how to put the mask on, etc. I have placed  an order in the chart. Please send the order, talk to patient, send report to referring MD. We will need a FU in sleep clinic for 10 weeks post-PAP set up, please arrange that with me or one of our  NPs. Also reinforce the need for compliance with treatment. Thanks,   True Mar, MD, PhD [facility])    ----- Message ----- From: Mar True, MD Sent: 06/12/2024   2:13 PM EDT To: True Mar, MD

## 2024-07-06 ENCOUNTER — Ambulatory Visit: Payer: Self-pay

## 2024-07-06 DIAGNOSIS — R197 Diarrhea, unspecified: Secondary | ICD-10-CM | POA: Diagnosis not present

## 2024-07-06 NOTE — Telephone Encounter (Signed)
 FYI Only or Action Required?: FYI only for provider.  Patient was last seen in primary care on 04/14/2024 by Kayla Jeoffrey RAMAN, FNP.  Called Nurse Triage reporting Diarrhea.  Symptoms began Friday.  Interventions attempted: Nothing.  Symptoms are: unchanged.  Triage Disposition: Home Care  Patient/caregiver understands and will follow disposition?: Yes     Message from [facility] G sent at 07/06/2024  9:48 AM EDT  antiobiotics done on Friday.SABRA Dacosta got worse?   Reason for Disposition  MILD-MODERATE diarrhea (e.g., 1-6 times / day more than normal)  Answer Assessment - Initial Assessment Questions 1. DIARRHEA SEVERITY: How bad is the diarrhea? How many more stools have you had in the past 24 hours than normal?      loose 2. ONSET: When did the diarrhea begin?      Saturday 3. STOOL DESCRIPTION:  How loose or watery is the diarrhea? What is the stool color? Is there any blood or mucous in the stool?     loose 4. VOMITING: Are you also vomiting? If Yes, ask: How many times in the past 24 hours?      no 5. ABDOMEN PAIN: Are you having any abdomen pain? If Yes, ask: What does it feel like? (e.g., crampy, dull, intermittent, constant)      no 6. ABDOMEN PAIN SEVERITY: If present, ask: How bad is the pain?  (e.g., Scale 1-10; mild, moderate, or severe)     no 7. ORAL INTAKE: If vomiting, Have you been able to drink liquids? How much liquids have you had in the past 24 hours?     Drinking. 8. HYDRATION: Any signs of dehydration? (e.g., dry mouth [not just dry lips], too weak to stand, dizziness, new weight loss) When did you last urinate?     No dry mouth yet 9. EXPOSURE: Have you traveled to a foreign country recently? Have you been exposed to anyone with diarrhea? Could you have eaten any food that was spoiled?     no 10. ANTIBIOTIC USE: Are you taking antibiotics now or have you taken antibiotics in the past 2 months?       yes 11. OTHER  SYMPTOMS: Do you have any other symptoms? (e.g., fever, blood in stool)       no 12. PREGNANCY: Is there any chance you are pregnant? When was your last menstrual period?       na  Protocols used: [facility]

## 2024-07-08 ENCOUNTER — Other Ambulatory Visit

## 2024-07-08 DIAGNOSIS — E782 Mixed hyperlipidemia: Secondary | ICD-10-CM | POA: Diagnosis not present

## 2024-07-08 DIAGNOSIS — Z6839 Body mass index (BMI) 39.0-39.9, adult: Secondary | ICD-10-CM

## 2024-07-08 DIAGNOSIS — I10 Essential (primary) hypertension: Secondary | ICD-10-CM

## 2024-07-08 DIAGNOSIS — R7303 Prediabetes: Secondary | ICD-10-CM | POA: Diagnosis not present

## 2024-07-09 LAB — CBC WITH DIFFERENTIAL/PLATELET
Absolute Lymphocytes: 2424 {cells}/uL (ref 850–3900)
Absolute Monocytes: 642 {cells}/uL (ref 200–950)
Basophils Absolute: 37 {cells}/uL (ref 0–200)
Basophils Relative: 0.5 %
Eosinophils Absolute: 131 {cells}/uL (ref 15–500)
Eosinophils Relative: 1.8 %
HCT: 43.3 % (ref 35.0–45.0)
Hemoglobin: 13.8 g/dL (ref 11.7–15.5)
MCH: 27.2 pg (ref 27.0–33.0)
MCHC: 31.9 g/dL — ABNORMAL LOW (ref 32.0–36.0)
MCV: 85.4 fL (ref 80.0–100.0)
MPV: 9.7 fL (ref 7.5–12.5)
Monocytes Relative: 8.8 %
Neutro Abs: 4066 {cells}/uL (ref 1500–7800)
Neutrophils Relative %: 55.7 %
Platelets: 268 Thousand/uL (ref 140–400)
RBC: 5.07 Million/uL (ref 3.80–5.10)
RDW: 13.8 % (ref 11.0–15.0)
Total Lymphocyte: 33.2 %
WBC: 7.3 Thousand/uL (ref 3.8–10.8)

## 2024-07-09 LAB — LIPID PANEL
Cholesterol: 180 mg/dL (ref <200)
HDL: 54 mg/dL (ref >=50)
LDL Cholesterol (Calc): 109 mg/dL — ABNORMAL HIGH
Non-HDL Cholesterol (Calc): 126 mg/dL (ref <130)
Total CHOL/HDL Ratio: 3.3 (calc) (ref <5.0)
Triglycerides: 83 mg/dL (ref <150)

## 2024-07-09 LAB — HEMOGLOBIN A1C
Hgb A1c MFr Bld: 5.9 % — ABNORMAL HIGH (ref <5.7)
Mean Plasma Glucose: 123 mg/dL
eAG (mmol/L): 6.8 mmol/L

## 2024-07-09 LAB — COMPREHENSIVE METABOLIC PANEL WITH GFR
AG Ratio: 1.6 (calc) (ref 1.0–2.5)
ALT: 25 U/L (ref 6–29)
AST: 21 U/L (ref 10–35)
Albumin: 4.4 g/dL (ref 3.6–5.1)
Alkaline phosphatase (APISO): 105 U/L (ref 37–153)
BUN: 15 mg/dL (ref 7–25)
CO2: 29 mmol/L (ref 20–32)
Calcium: 9.6 mg/dL (ref 8.6–10.4)
Chloride: 100 mmol/L (ref 98–110)
Creat: 0.71 mg/dL (ref 0.50–1.03)
Globulin: 2.7 g/dL (ref 1.9–3.7)
Glucose, Bld: 83 mg/dL (ref 65–99)
Potassium: 3.3 mmol/L — ABNORMAL LOW (ref 3.5–5.3)
Sodium: 140 mmol/L (ref 135–146)
Total Bilirubin: 0.5 mg/dL (ref 0.2–1.2)
Total Protein: 7.1 g/dL (ref 6.1–8.1)
eGFR: 102 mL/min/1.73m2 (ref >=60)

## 2024-07-10 ENCOUNTER — Other Ambulatory Visit: Payer: Self-pay | Admitting: Medical Genetics

## 2024-07-14 ENCOUNTER — Ambulatory Visit: Admitting: Family Medicine

## 2024-07-14 ENCOUNTER — Ambulatory Visit: Payer: Self-pay | Admitting: Family Medicine

## 2024-07-14 ENCOUNTER — Encounter: Payer: Self-pay | Admitting: Family Medicine

## 2024-07-14 VITALS — BP 138/82 | HR 85 | Temp 98.6°F | Ht 68.0 in | Wt 269.1 lb

## 2024-07-14 DIAGNOSIS — I10 Essential (primary) hypertension: Secondary | ICD-10-CM | POA: Diagnosis not present

## 2024-07-14 NOTE — Assessment & Plan Note (Signed)
 BP at goal <140/90. Continue Amlodipine  10mg  daily and stop hydrochlorothiazide  25mg  daily. Monitor BP at home and report readings to office in 1 week. Recommend heart healthy diet such as Mediterranean diet with whole grains, fruits, vegetable, fish, lean meats, nuts, and olive oil. Limit salt. Encouraged moderate walking, 3-5 times/week for 30-50 minutes each session. Aim for at least 150 minutes.week. Goal should be pace of 3 miles/hours, or walking 1.5 miles in 30 minutes. Avoid tobacco products. Avoid excess alcohol. Take medications as prescribed and bring medications and blood pressure log with cuff to each office visit. Seek medical care for chest pain, palpitations, shortness of breath with exertion, dizziness/lightheadedness, vision changes, recurrent headaches, or swelling of extremities. Continue to monitor at home and return to office if sustains >140/90. Follow up in 1 month with labs week prior

## 2024-07-14 NOTE — Progress Notes (Signed)
 Subjective:  HPI: Sara Hickman is a 53 y.o. female presenting on 07/14/2024 for No chief complaint on file.   HPI Patient is in today for HTN follow up. Is taking Amlodipine  10mg  daily and HCTZ 25mg  only 3 times weekly. She was hypokalemic on recent labs however reports that she had severe diarrhea for several days due to a GI illness. This was self-limited and had resolved the day prior to having labs drawn. Noncompliant with daily hydrochlorothiazide  due to urinary frequency. She denies chest pain, palpitations, recurrent headaches, vision changes, lightheadedness, dizziness, dyspnea on exertion, or swelling of extremities. Is not monitoring her BP at home however when she does check it is 130-140/80s.  Review of Systems  All other systems reviewed and are negative.   Relevant past medical history reviewed and updated as indicated.   Past Medical History:  Diagnosis Date   Fibroids    GERD (gastroesophageal reflux disease)    SVD (spontaneous vaginal delivery)    x 1     Past Surgical History:  Procedure Laterality Date   ABDOMINAL HYSTERECTOMY N/A 12/03/2013   Procedure: HYSTERECTOMY ABDOMINAL;  Surgeon: Nena DELENA App, MD;  Location: [facility];  Service: Gynecology;  Laterality: N/A;   BILATERAL SALPINGECTOMY Bilateral 12/03/2013   Procedure: BILATERAL SALPINGECTOMY;  Surgeon: Nena DELENA App, MD;  Location: [facility];  Service: Gynecology;  Laterality: Bilateral;    Allergies and medications reviewed and updated.   Current Outpatient Medications:    amLODipine  (NORVASC ) 10 MG tablet, Take 1 tablet (10 mg total) by mouth daily., Disp: 90 tablet, Rfl: 1   cetirizine  (ZYRTEC ) 10 MG chewable tablet, Chew 10 mg by mouth daily as needed for allergies., Disp: , Rfl:    Cholecalciferol (VITAMIN D3) 25 MCG (1000 UT) CAPS, , Disp: , Rfl:    Cyanocobalamin (VITAMIN B 12 PO), , Disp: , Rfl:    esomeprazole  (NEXIUM ) 40 MG capsule, Take 1 capsule (40 mg total) by mouth daily.,  Disp: 90 capsule, Rfl: 0   fish oil-omega-3 fatty acids 1000 MG capsule, Take 1 g by mouth daily., Disp: , Rfl:    fluticasone  (FLONASE ) 50 MCG/ACT nasal spray, Place 2 sprays into both nostrils daily., Disp: 16 g, Rfl: 0   hydrochlorothiazide  (HYDRODIURIL ) 25 MG tablet, Take 1 tablet (25 mg total) by mouth daily., Disp: 90 tablet, Rfl: 0   Multiple Vitamin (MULTIVITAMIN ADULT PO), multivitamin, Disp: , Rfl:    topiramate (TOPAMAX) 25 MG tablet, Take 25 mg by mouth daily., Disp: , Rfl:   No Known Allergies  Objective:   BP 138/82   Pulse 85   Temp 98.6 F (37 C)   Ht 5' 8 (1.727 m)   Wt 269 lb 2 oz (122.1 kg)   LMP 09/24/2012   SpO2 98%   BMI 40.92 kg/m      07/14/2024    4:06 PM 05/05/2024    9:24 AM 04/14/2024    4:06 PM  Vitals with BMI  Height 5' 8 5' 8 5' 8  Weight 269 lbs 2 oz 267 lbs 3 oz 268 lbs 6 oz  BMI 40.93 40.64 40.82  Systolic 138 125 863  Diastolic 82 83 84  Pulse 85 78 78     Physical Exam Vitals and nursing note reviewed.  Constitutional:      Appearance: Normal appearance. She is obese.  HENT:     Head: Normocephalic and atraumatic.  Cardiovascular:     Rate and Rhythm: Normal rate and regular rhythm.  Pulses: Normal pulses.     Heart sounds: Normal heart sounds.  Pulmonary:     Effort: Pulmonary effort is normal.     Breath sounds: Normal breath sounds.  Skin:    General: Skin is warm and dry.  Neurological:     General: No focal deficit present.     Mental Status: She is alert and oriented to person, place, and time. Mental status is at baseline.  Psychiatric:        Mood and Affect: Mood normal.        Behavior: Behavior normal.        Thought Content: Thought content normal.        Judgment: Judgment normal.     Assessment & Plan:  Essential hypertension, benign Assessment & Plan: BP at goal <140/90. Continue Amlodipine  10mg  daily and stop hydrochlorothiazide  25mg  daily. Monitor BP at home and report readings to office in 1  week. Recommend heart healthy diet such as Mediterranean diet with whole grains, fruits, vegetable, fish, lean meats, nuts, and olive oil. Limit salt. Encouraged moderate walking, 3-5 times/week for 30-50 minutes each session. Aim for at least 150 minutes.week. Goal should be pace of 3 miles/hours, or walking 1.5 miles in 30 minutes. Avoid tobacco products. Avoid excess alcohol. Take medications as prescribed and bring medications and blood pressure log with cuff to each office visit. Seek medical care for chest pain, palpitations, shortness of breath with exertion, dizziness/lightheadedness, vision changes, recurrent headaches, or swelling of extremities. Continue to monitor at home and return to office if sustains >140/90. Follow up in 1 month with labs week prior      Follow up plan: Return in about 4 weeks (around 08/11/2024) for chronic follow-up with labs 1 week prior.  Jeoffrey GORMAN Barrio, FNP

## 2024-07-15 ENCOUNTER — Other Ambulatory Visit (HOSPITAL_COMMUNITY)
Admission: RE | Admit: 2024-07-15 | Discharge: 2024-07-15 | Disposition: A | Discharge status: Home / Self Care | Payer: Self-pay | Source: Ambulatory Visit | Attending: Medical Genetics | Admitting: Medical Genetics

## 2024-07-23 ENCOUNTER — Telehealth: Payer: Self-pay | Admitting: Neurology

## 2024-07-23 NOTE — Telephone Encounter (Signed)
 She will have to discuss Zepbound Rx with PCP. Zepbound does not treat OSA. For moderate OSA, PAP therapy is first-line. If she would like to consider an oral appliance, we can facilitate with a referral to dentistry.

## 2024-07-23 NOTE — Telephone Encounter (Signed)
 I called the patient. She says that her insurance (even though she has secondary plan [employer] as well) will not cover the cpap fully so she is responsible for a significant amount. She says that she is interested alternatively in Zepbound as [employer] will cover it. She will talk to her pcp about it. I told her I would let Dr Buck know her choice.

## 2024-07-23 NOTE — Telephone Encounter (Signed)
 Sent a message to [facility] to check on cpap authorization.

## 2024-07-23 NOTE — Telephone Encounter (Signed)
 Received message from [facility]:  Zott, Stacy  Danica Camarena, Heather CROME, RN; [place], [place]; Darrel Boyer We had just needed some information on her secondary insurance, which we got.  Her order is approved and we have left her messages to call us  back to schedule her setup appt. I have emailed the local office to give her another call. Thank you  ---------  I called the patient. She is waiting for the call back. I also told her Dr Obie message. She verbalized understanding and will let us  know what she decides.

## 2024-07-23 NOTE — Telephone Encounter (Signed)
 Pt called stating that she is having a hard time with the ins covering her Cpap machine so she is wanting to know if the provider can prescribe Zepbound and that way they will pay for both through her [employer] meds by mail. Please advise.

## 2024-07-24 LAB — GENECONNECT MOLECULAR SCREEN: Genetic Analysis Overall Interpretation: NEGATIVE

## 2024-08-12 ENCOUNTER — Other Ambulatory Visit

## 2024-08-17 ENCOUNTER — Ambulatory Visit: Admitting: Family Medicine

## 2024-09-09 ENCOUNTER — Telehealth: Admitting: Family Medicine

## 2024-09-14 DIAGNOSIS — R7303 Prediabetes: Secondary | ICD-10-CM | POA: Diagnosis not present

## 2024-09-14 DIAGNOSIS — Z1331 Encounter for screening for depression: Secondary | ICD-10-CM | POA: Diagnosis not present

## 2024-09-14 DIAGNOSIS — R635 Abnormal weight gain: Secondary | ICD-10-CM | POA: Diagnosis not present

## 2024-09-14 DIAGNOSIS — G4733 Obstructive sleep apnea (adult) (pediatric): Secondary | ICD-10-CM | POA: Diagnosis not present

## 2024-09-14 DIAGNOSIS — I10 Essential (primary) hypertension: Secondary | ICD-10-CM | POA: Diagnosis not present

## 2024-09-29 DIAGNOSIS — I10 Essential (primary) hypertension: Secondary | ICD-10-CM | POA: Diagnosis not present

## 2024-09-29 DIAGNOSIS — G4733 Obstructive sleep apnea (adult) (pediatric): Secondary | ICD-10-CM | POA: Diagnosis not present

## 2024-09-29 DIAGNOSIS — R7303 Prediabetes: Secondary | ICD-10-CM | POA: Diagnosis not present

## 2024-10-26 ENCOUNTER — Telehealth: Payer: Self-pay | Admitting: Neurology

## 2024-10-26 NOTE — Progress Notes (Unsigned)
 PATIENT: Sara Hickman DOB: 1971-10-12  REASON FOR VISIT: follow up HISTORY FROM: patient  Virtual Visit via MyChart video  I connected with Solomia R Hickman on 10/28/24 at 10:00 AM EST via MyChart video and verified that I am speaking with the correct person using two identifiers.   I discussed the limitations, risks, security and privacy concerns of performing an evaluation and management service by Mychart video and the availability of in person appointments. I also discussed with the patient that there may be a patient responsible charge related to this service. The patient expressed understanding and agreed to proceed.   History of Present Illness:  10/28/24 ALL (MyChart): Sara Hickman is a 53 y.o. female here today for follow up for OSA on CPAP. She was seen in consult with Dr Buck 04/2024 d/t daytime sleepiness and witnessed apneic events. HST showed moderate obstructive sleep apnea with a total AHI of 20.1/hour and O2 nadir of 80.2%. AutoPAP advised. Since, she reports doing well. She is using her husbands machine. [facility] reset machine to Dr Obie ordered settings. She reports doing very well on therapy. She is feeling better rested. Sleeping more soundly. She denies concerns with machine or supplies.    History (copied from Dr Obie previous note)  Dear Jeoffrey,   I saw your patient, Sara Hickman, upon your kind request in my sleep clinic today for initial consultation of her sleep disorder, in particular, concern for underlying obstructive sleep apnea.  The patient is unaccompanied today.  As you know, Ms. Kaser is a 53 year old female with an underlying medical history of hypertension, reflux disease, and severe obesity with a BMI of over 40, who reports snoring and excessive daytime somnolence, as well as witnessed apneas per husband's observation.  Her Epworth sleepiness score is 9 out of 24, fatigue severity score is 38 out of 63.  I  reviewed your office note from 04/14/2024.  She does not wake up rested, she has nocturia about 2-3 times per average night.  She makes enough time for sleep but has trouble maintaining sleep.  She goes to bed around 9:30 PM and rise time is around 7 AM.  She lives with her husband and her son.  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  Her sister has sleep apnea and uses a PAP machine.  Patient denies recurrent nocturnal morning headaches.  She does not drink caffeine daily.  She drinks alcohol infrequently, maybe once a month.  She is a non-smoker.  She does have a TV on in her bedroom and it may stay on all night or go up on the timer or may be turned off by one of them.  They do not have any pets in the household.   Observations/Objective:  Generalized: Well developed, in no acute distress  Mentation: Alert oriented to time, place, history taking. Follows all commands speech and language fluent   Assessment and Plan:  53 y.o. year old female  has a past medical history of Fibroids, GERD (gastroesophageal reflux disease), and SVD (spontaneous vaginal delivery). here with    ICD-10-CM   1. OSA on CPAP  G47.33       Traeh is doing well on CPAP therapy. Compliance report not available during visit. She will ask [facility] to reassign machine to her and attach out office to her account for compliance review. She was encouraged to continue CPAP nightly for at least 4 hours. Healthy lifestyle habits encouraged. She  will follow up in 1 year, sooner if needed.    No orders of the defined types were placed in this encounter.   No orders of the defined types were placed in this encounter.    Follow Up Instructions:  I discussed the assessment and treatment plan with the patient. The patient was provided an opportunity to ask questions and all were answered. The patient agreed with the plan and demonstrated an understanding of the instructions.   The  patient was advised to call back or seek an in-person evaluation if the symptoms worsen or if the condition fails to improve as anticipated.  I provided 15 minutes of face-to-face and non face-to-face time during this MyChart video encounter. Patient located at their place of residence. Provider is in the office.    Annalese Stiner, NP

## 2024-10-26 NOTE — Telephone Encounter (Signed)
 Patient called to change appointment to a MyChart Visit due to original appointment was a MyChart Visit.  Change appointment to a MyChart Visit.

## 2024-10-26 NOTE — Patient Instructions (Signed)

## 2024-10-27 ENCOUNTER — Telehealth: Payer: Self-pay

## 2024-10-27 NOTE — Telephone Encounter (Signed)
 Called pt and asked her if she was using her CPAP Machine because I couldn't find her in our database for [facility], pt stated that she doesn't have her own machine and is using her husband's CPAP Machine sometimes. Pt states she never got her CPAP Machine.

## 2024-10-28 ENCOUNTER — Encounter: Payer: Self-pay | Admitting: Family Medicine

## 2024-10-28 ENCOUNTER — Telehealth (INDEPENDENT_AMBULATORY_CARE_PROVIDER_SITE_OTHER): Admitting: Family Medicine

## 2024-10-28 DIAGNOSIS — G4733 Obstructive sleep apnea (adult) (pediatric): Secondary | ICD-10-CM

## 2024-10-28 NOTE — Progress Notes (Signed)
 I spoke with the patient yesterday and asked her to bring the machine she has next week to try and get a download on it

## 2024-11-17 DIAGNOSIS — R7303 Prediabetes: Secondary | ICD-10-CM | POA: Diagnosis not present

## 2024-11-17 DIAGNOSIS — I10 Essential (primary) hypertension: Secondary | ICD-10-CM | POA: Diagnosis not present

## 2024-11-17 DIAGNOSIS — G4733 Obstructive sleep apnea (adult) (pediatric): Secondary | ICD-10-CM | POA: Diagnosis not present

## 2024-11-30 ENCOUNTER — Other Ambulatory Visit: Payer: Self-pay | Admitting: Family Medicine

## 2025-01-12 ENCOUNTER — Other Ambulatory Visit: Payer: Self-pay | Admitting: Family Medicine

## 2025-01-12 DIAGNOSIS — Z1231 Encounter for screening mammogram for malignant neoplasm of breast: Secondary | ICD-10-CM

## 2025-02-18 ENCOUNTER — Ambulatory Visit

## 2025-03-12 ENCOUNTER — Other Ambulatory Visit

## 2025-03-18 ENCOUNTER — Encounter: Admitting: Family Medicine
